# Patient Record
Sex: Female | Born: 1970 | Race: White | Hispanic: No | Marital: Married | Smoking: Former smoker
Health system: Southern US, Community
[De-identification: ages and names within clinical notes are randomized; demographics above are authoritative.]

## PROBLEM LIST (undated history)

## (undated) DIAGNOSIS — R002 Palpitations: Secondary | ICD-10-CM

## (undated) DIAGNOSIS — I341 Nonrheumatic mitral (valve) prolapse: Secondary | ICD-10-CM

## (undated) DIAGNOSIS — R071 Chest pain on breathing: Secondary | ICD-10-CM

## (undated) HISTORY — PX: DILATION AND CURETTAGE OF UTERUS: SHX78

## (undated) HISTORY — DX: Nonrheumatic mitral (valve) prolapse: I34.1

## (undated) HISTORY — DX: Palpitations: R00.2

## (undated) HISTORY — DX: Chest pain on breathing: R07.1

---

## 1997-05-01 ENCOUNTER — Emergency Department (HOSPITAL_COMMUNITY): Admission: EM | Admit: 1997-05-01 | Discharge: 1997-05-01 | Payer: Self-pay | Admitting: Emergency Medicine

## 1998-09-26 ENCOUNTER — Inpatient Hospital Stay (HOSPITAL_COMMUNITY): Admission: AD | Admit: 1998-09-26 | Discharge: 1998-09-26 | Payer: Self-pay | Admitting: Gynecology

## 1998-09-26 ENCOUNTER — Encounter: Payer: Self-pay | Admitting: Gynecology

## 1998-12-10 ENCOUNTER — Inpatient Hospital Stay (HOSPITAL_COMMUNITY): Admission: AD | Admit: 1998-12-10 | Discharge: 1998-12-13 | Payer: Self-pay | Admitting: Obstetrics and Gynecology

## 2000-09-22 ENCOUNTER — Other Ambulatory Visit: Admission: RE | Admit: 2000-09-22 | Discharge: 2000-09-22 | Payer: Self-pay | Admitting: Gynecology

## 2000-10-18 ENCOUNTER — Emergency Department (HOSPITAL_COMMUNITY): Admission: EM | Admit: 2000-10-18 | Discharge: 2000-10-18 | Payer: Self-pay | Admitting: Emergency Medicine

## 2001-04-12 ENCOUNTER — Emergency Department (HOSPITAL_COMMUNITY): Admission: EM | Admit: 2001-04-12 | Discharge: 2001-04-12 | Payer: Self-pay | Admitting: Emergency Medicine

## 2001-04-25 ENCOUNTER — Encounter: Payer: Self-pay | Admitting: Family Medicine

## 2001-04-25 ENCOUNTER — Encounter: Admission: RE | Admit: 2001-04-25 | Discharge: 2001-04-25 | Payer: Self-pay | Admitting: Family Medicine

## 2002-09-26 ENCOUNTER — Other Ambulatory Visit: Admission: RE | Admit: 2002-09-26 | Discharge: 2002-09-26 | Payer: Self-pay | Admitting: Gynecology

## 2003-06-04 ENCOUNTER — Encounter: Payer: Self-pay | Admitting: Internal Medicine

## 2003-06-04 ENCOUNTER — Emergency Department (HOSPITAL_COMMUNITY): Admission: EM | Admit: 2003-06-04 | Discharge: 2003-06-04 | Payer: Self-pay

## 2003-06-25 ENCOUNTER — Emergency Department (HOSPITAL_COMMUNITY): Admission: EM | Admit: 2003-06-25 | Discharge: 2003-06-25 | Payer: Self-pay | Admitting: Emergency Medicine

## 2003-07-19 ENCOUNTER — Inpatient Hospital Stay (HOSPITAL_COMMUNITY): Admission: AD | Admit: 2003-07-19 | Discharge: 2003-07-21 | Payer: Self-pay | Admitting: Gynecology

## 2003-08-25 ENCOUNTER — Inpatient Hospital Stay (HOSPITAL_COMMUNITY): Admission: AD | Admit: 2003-08-25 | Discharge: 2003-08-25 | Payer: Self-pay | Admitting: Gynecology

## 2003-09-02 ENCOUNTER — Other Ambulatory Visit: Admission: RE | Admit: 2003-09-02 | Discharge: 2003-09-02 | Payer: Self-pay | Admitting: Gynecology

## 2004-04-21 ENCOUNTER — Emergency Department (HOSPITAL_COMMUNITY): Admission: EM | Admit: 2004-04-21 | Discharge: 2004-04-21 | Payer: Self-pay | Admitting: Emergency Medicine

## 2005-09-21 ENCOUNTER — Other Ambulatory Visit: Admission: RE | Admit: 2005-09-21 | Discharge: 2005-09-21 | Payer: Self-pay | Admitting: Gynecology

## 2006-06-25 ENCOUNTER — Encounter: Payer: Self-pay | Admitting: Internal Medicine

## 2006-06-25 ENCOUNTER — Emergency Department (HOSPITAL_COMMUNITY): Admission: EM | Admit: 2006-06-25 | Discharge: 2006-06-25 | Payer: Self-pay | Admitting: Emergency Medicine

## 2006-06-27 ENCOUNTER — Ambulatory Visit: Payer: Self-pay | Admitting: Internal Medicine

## 2006-10-19 ENCOUNTER — Ambulatory Visit: Payer: Self-pay | Admitting: Internal Medicine

## 2007-03-03 ENCOUNTER — Ambulatory Visit: Payer: Self-pay | Admitting: Family Medicine

## 2007-07-11 ENCOUNTER — Telehealth (INDEPENDENT_AMBULATORY_CARE_PROVIDER_SITE_OTHER): Payer: Self-pay | Admitting: *Deleted

## 2007-10-23 ENCOUNTER — Ambulatory Visit: Payer: Self-pay | Admitting: Internal Medicine

## 2007-10-23 LAB — CONVERTED CEMR LAB
ALT: 11 units/L (ref 0–35)
AST: 16 units/L (ref 0–37)
Albumin: 4 g/dL (ref 3.5–5.2)
BUN: 9 mg/dL (ref 6–23)
Basophils Relative: 0.5 % (ref 0.0–3.0)
CO2: 29 meq/L (ref 19–32)
Calcium: 8.9 mg/dL (ref 8.4–10.5)
Chloride: 104 meq/L (ref 96–112)
Cholesterol: 137 mg/dL (ref 0–200)
Eosinophils Relative: 1.4 % (ref 0.0–5.0)
GFR calc Af Amer: 146 mL/min
Glucose, Bld: 86 mg/dL (ref 70–99)
Glucose, Urine, Semiquant: NEGATIVE
HCT: 34 % — ABNORMAL LOW (ref 36.0–46.0)
LDL Cholesterol: 76 mg/dL (ref 0–99)
Lymphocytes Relative: 38.1 % (ref 12.0–46.0)
MCHC: 34.8 g/dL (ref 30.0–36.0)
MCV: 89.7 fL (ref 78.0–100.0)
Monocytes Relative: 11.5 % (ref 3.0–12.0)
Neutrophils Relative %: 48.5 % (ref 43.0–77.0)
Platelets: 111 10*3/uL — ABNORMAL LOW (ref 150–400)
Potassium: 4.4 meq/L (ref 3.5–5.1)
RBC: 3.79 M/uL — ABNORMAL LOW (ref 3.87–5.11)
RDW: 12 % (ref 11.5–14.6)
Sodium: 139 meq/L (ref 135–145)
Total Protein: 6.6 g/dL (ref 6.0–8.3)
Triglycerides: 40 mg/dL (ref 0–149)
Urobilinogen, UA: 1
WBC Urine, dipstick: NEGATIVE
pH: 7

## 2007-10-30 ENCOUNTER — Ambulatory Visit: Payer: Self-pay | Admitting: Internal Medicine

## 2007-10-30 DIAGNOSIS — R002 Palpitations: Secondary | ICD-10-CM | POA: Insufficient documentation

## 2007-10-30 HISTORY — DX: Palpitations: R00.2

## 2007-11-03 ENCOUNTER — Ambulatory Visit: Payer: Self-pay | Admitting: Gynecology

## 2007-11-03 ENCOUNTER — Other Ambulatory Visit: Admission: RE | Admit: 2007-11-03 | Discharge: 2007-11-03 | Payer: Self-pay | Admitting: Gynecology

## 2007-11-03 ENCOUNTER — Encounter: Payer: Self-pay | Admitting: Gynecology

## 2007-11-10 ENCOUNTER — Encounter: Admission: RE | Admit: 2007-11-10 | Discharge: 2007-11-10 | Payer: Self-pay | Admitting: Gynecology

## 2008-06-24 ENCOUNTER — Ambulatory Visit (HOSPITAL_BASED_OUTPATIENT_CLINIC_OR_DEPARTMENT_OTHER): Admission: RE | Admit: 2008-06-24 | Discharge: 2008-06-24 | Payer: Self-pay | Admitting: Urology

## 2008-07-04 HISTORY — PX: BLADDER SUSPENSION: SHX72

## 2008-09-19 ENCOUNTER — Ambulatory Visit: Payer: Self-pay | Admitting: Gynecology

## 2008-10-25 ENCOUNTER — Ambulatory Visit: Payer: Self-pay | Admitting: Internal Medicine

## 2008-10-25 LAB — CONVERTED CEMR LAB
ALT: 14 units/L (ref 0–35)
AST: 18 units/L (ref 0–37)
Alkaline Phosphatase: 42 units/L (ref 39–117)
Basophils Absolute: 0 10*3/uL (ref 0.0–0.1)
Basophils Relative: 0.2 % (ref 0.0–3.0)
Cholesterol: 140 mg/dL (ref 0–200)
Creatinine, Ser: 0.7 mg/dL (ref 0.4–1.2)
Eosinophils Absolute: 0 10*3/uL (ref 0.0–0.7)
GFR calc non Af Amer: 100.43 mL/min (ref >60)
Glucose, Bld: 75 mg/dL (ref 70–99)
LDL Cholesterol: 77 mg/dL (ref 0–99)
Lymphocytes Relative: 25.8 % (ref 12.0–46.0)
MCHC: 33.3 g/dL (ref 30.0–36.0)
Monocytes Relative: 13.4 % — ABNORMAL HIGH (ref 3.0–12.0)
Platelets: 122 10*3/uL — ABNORMAL LOW (ref 150.0–400.0)
Specific Gravity, Urine: >=1.03 (ref 1.000–1.030)
Total Bilirubin: 1.4 mg/dL — ABNORMAL HIGH (ref 0.3–1.2)
Total Protein, Urine: 30 mg/dL
Urobilinogen, UA: 1 (ref 0.0–1.0)
WBC: 4.7 10*3/uL (ref 4.5–10.5)
pH: 5.5 (ref 5.0–8.0)

## 2008-10-30 ENCOUNTER — Encounter (INDEPENDENT_AMBULATORY_CARE_PROVIDER_SITE_OTHER): Payer: Self-pay | Admitting: *Deleted

## 2008-10-30 LAB — CONVERTED CEMR LAB
Bilirubin Urine: NEGATIVE
Blood in Urine, dipstick: NEGATIVE
Glucose, Urine, Semiquant: NEGATIVE
Ketones, urine, test strip: NEGATIVE
Specific Gravity, Urine: 1.01
pH: 6.5

## 2008-11-01 ENCOUNTER — Ambulatory Visit: Payer: Self-pay | Admitting: Internal Medicine

## 2008-11-04 ENCOUNTER — Other Ambulatory Visit: Admission: RE | Admit: 2008-11-04 | Discharge: 2008-11-04 | Payer: Self-pay | Admitting: Gynecology

## 2008-11-04 ENCOUNTER — Ambulatory Visit: Payer: Self-pay | Admitting: Gynecology

## 2008-11-04 ENCOUNTER — Encounter: Payer: Self-pay | Admitting: Gynecology

## 2008-12-23 ENCOUNTER — Ambulatory Visit: Payer: Self-pay | Admitting: Oncology

## 2008-12-23 ENCOUNTER — Inpatient Hospital Stay (HOSPITAL_COMMUNITY): Admission: EM | Admit: 2008-12-23 | Discharge: 2008-12-25 | Payer: Self-pay | Admitting: Internal Medicine

## 2008-12-27 ENCOUNTER — Ambulatory Visit: Payer: Self-pay | Admitting: Internal Medicine

## 2008-12-30 ENCOUNTER — Ambulatory Visit: Payer: Self-pay | Admitting: Oncology

## 2009-01-03 ENCOUNTER — Ambulatory Visit: Payer: Self-pay | Admitting: Internal Medicine

## 2009-01-06 LAB — CONVERTED CEMR LAB
Basophils Relative: 0.6 % (ref 0.0–3.0)
Eosinophils Relative: 0.8 % (ref 0.0–5.0)
Hemoglobin: 11.8 g/dL — ABNORMAL LOW (ref 12.0–15.0)
MCHC: 33.7 g/dL (ref 30.0–36.0)
Monocytes Relative: 11.8 % (ref 3.0–12.0)
Neutrophils Relative %: 69.1 % (ref 43.0–77.0)

## 2009-02-25 ENCOUNTER — Ambulatory Visit: Payer: Self-pay | Admitting: Oncology

## 2009-02-25 LAB — MORPHOLOGY

## 2009-02-25 LAB — CBC & DIFF AND RETIC
Basophils Absolute: 0 10*3/uL (ref 0.0–0.1)
EOS%: 0.5 % (ref 0.0–7.0)
Eosinophils Absolute: 0 10*3/uL (ref 0.0–0.5)
HCT: 36.1 % (ref 34.8–46.6)
Immature Retic Fract: 1.6 % (ref 0.00–10.70)
LYMPH%: 26.3 % (ref 14.0–49.7)
MCV: 89.6 fL (ref 79.5–101.0)
MONO#: 0.4 10*3/uL (ref 0.1–0.9)
MONO%: 7.3 % (ref 0.0–14.0)
NEUT#: 3.7 10*3/uL (ref 1.5–6.5)
NEUT%: 65.7 % (ref 38.4–76.8)
Platelets: 167 10*3/uL (ref 145–400)
RDW: 12.5 % (ref 11.2–14.5)
Retic %: 0.51 % (ref 0.50–1.50)
Retic Ct Abs: 20.55 10*3/uL (ref 18.30–72.70)
WBC: 5.6 10*3/uL (ref 3.9–10.3)

## 2009-02-25 LAB — COMPREHENSIVE METABOLIC PANEL
ALT: 8 U/L (ref 0–35)
AST: 15 U/L (ref 0–37)
BUN: 10 mg/dL (ref 6–23)
Potassium: 4.3 mEq/L (ref 3.5–5.3)
Sodium: 139 mEq/L (ref 135–145)
Total Protein: 7 g/dL (ref 6.0–8.3)

## 2009-02-25 LAB — SEDIMENTATION RATE: Sed Rate: 6 mm/hr (ref 0–22)

## 2009-02-25 LAB — CHCC SMEAR

## 2009-03-04 ENCOUNTER — Encounter: Payer: Self-pay | Admitting: Internal Medicine

## 2009-03-26 ENCOUNTER — Ambulatory Visit: Payer: Self-pay | Admitting: Family Medicine

## 2009-03-26 DIAGNOSIS — R071 Chest pain on breathing: Secondary | ICD-10-CM

## 2009-03-26 HISTORY — DX: Chest pain on breathing: R07.1

## 2009-03-27 ENCOUNTER — Ambulatory Visit: Payer: Self-pay | Admitting: Family Medicine

## 2009-07-26 IMAGING — MG MM DIAGNOSTIC BILATERAL
5 series · 5 of 5 positions shown · non-contrast
Comparison: None

November 14, 2007 –DUPLICATE COPY for exam association in [facility]. No change from original report.
CLINICAL DATA: Patient presents with palpable abnormality in the
 left breast. Baseline exam.

 DIGITAL DIAGNOSTIC BILATERAL MAMMOGRAM OLD WITH CAD AND LEFT
 BREAST ULTRASOUND:

[R CC]
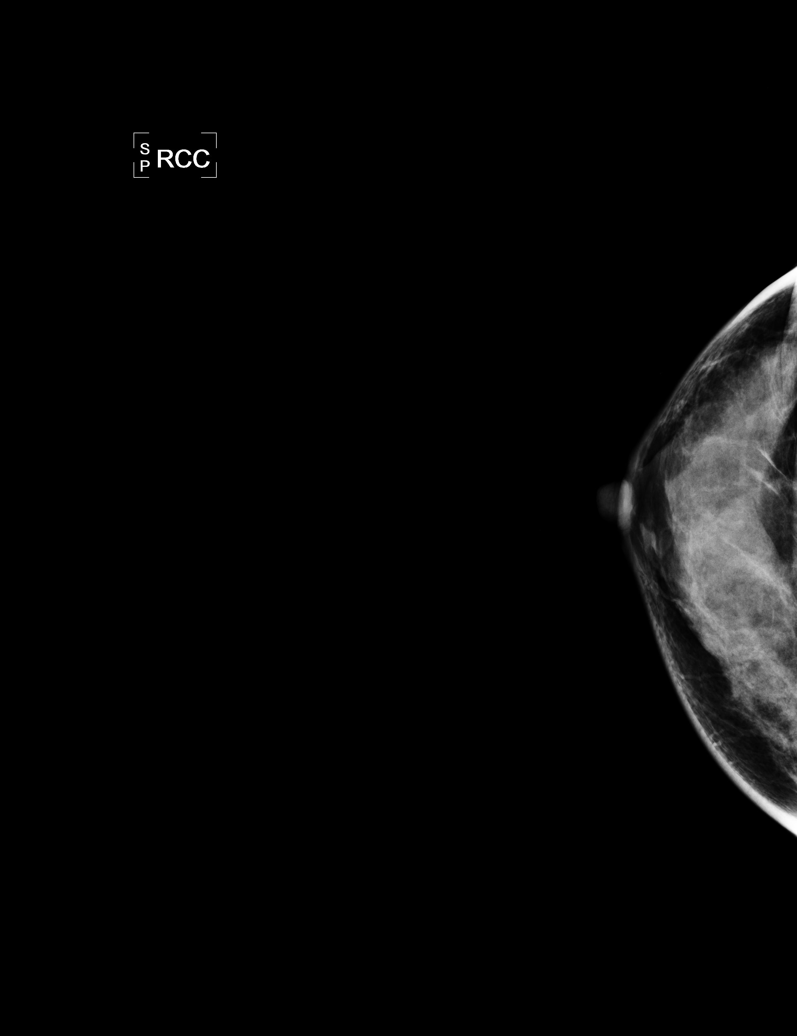

[L CC]
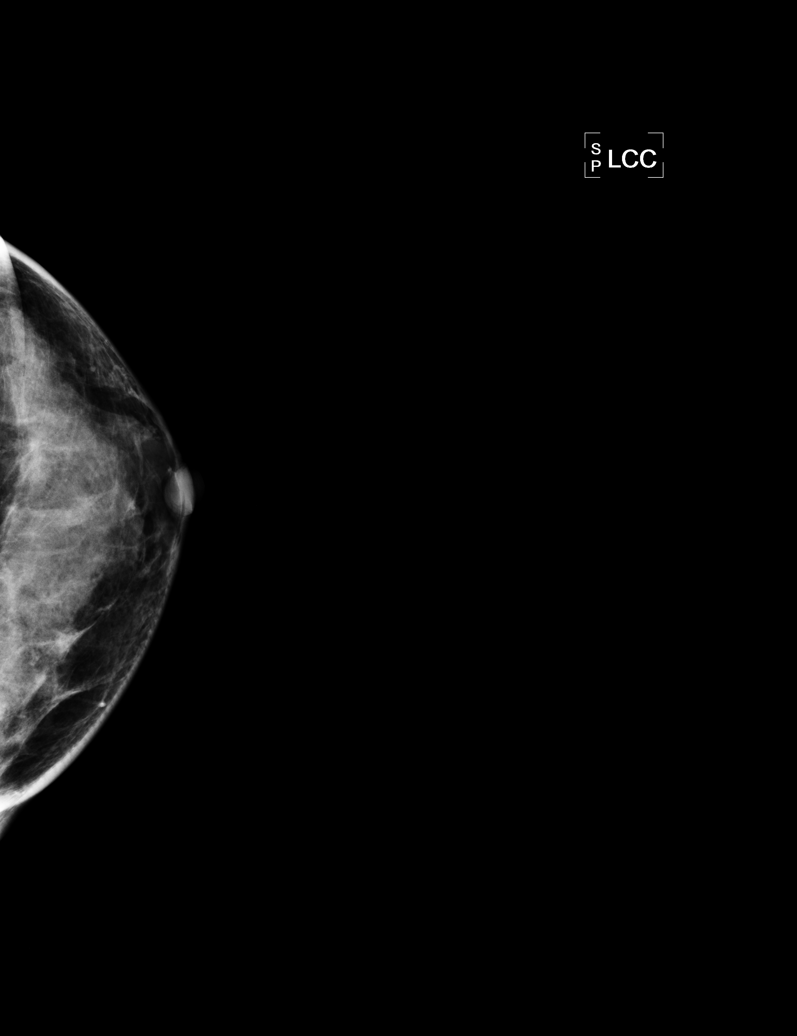

[L MLO]
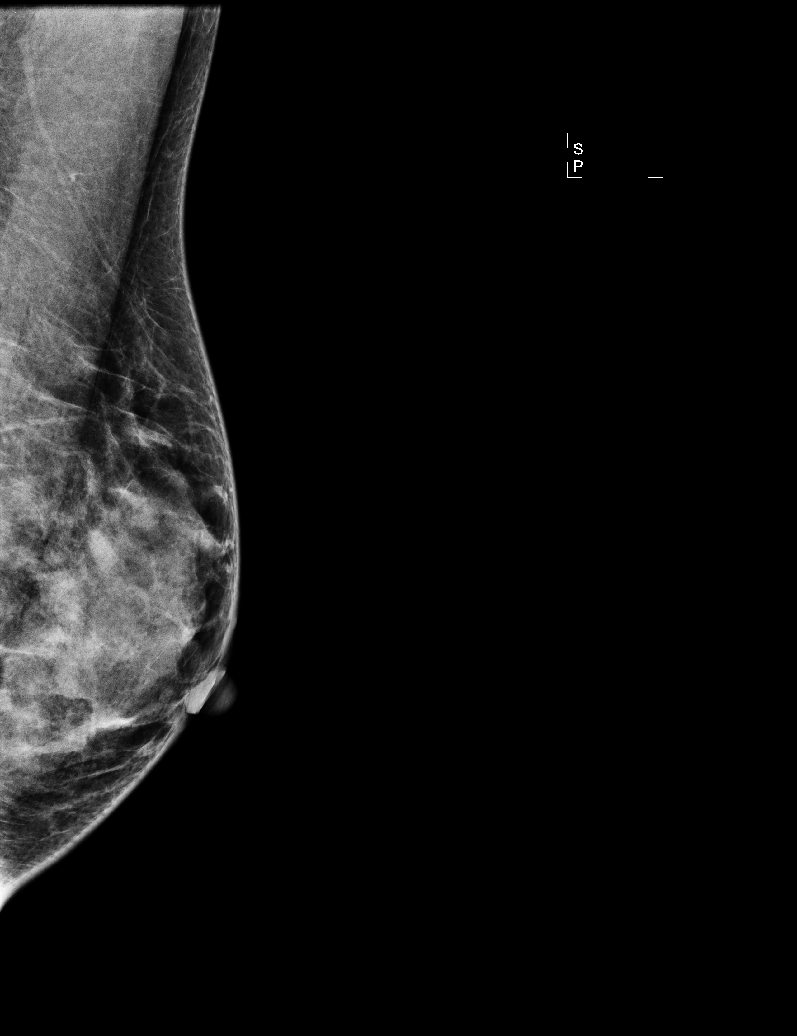

[R MLO]
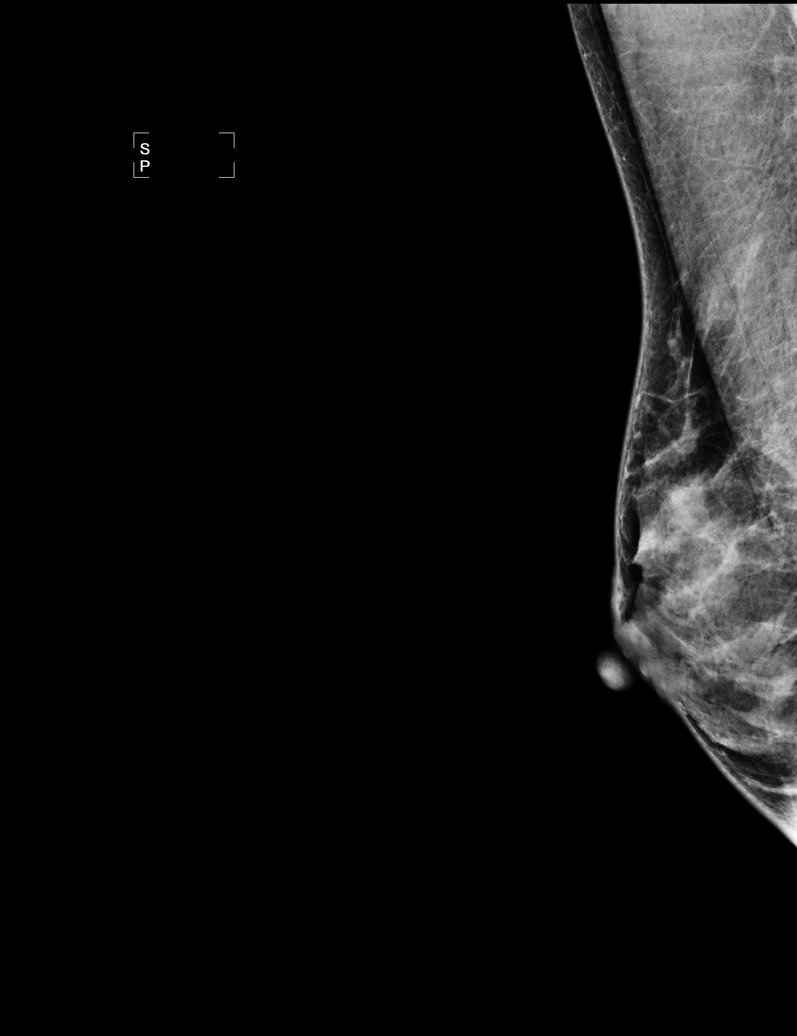

[L TAN]
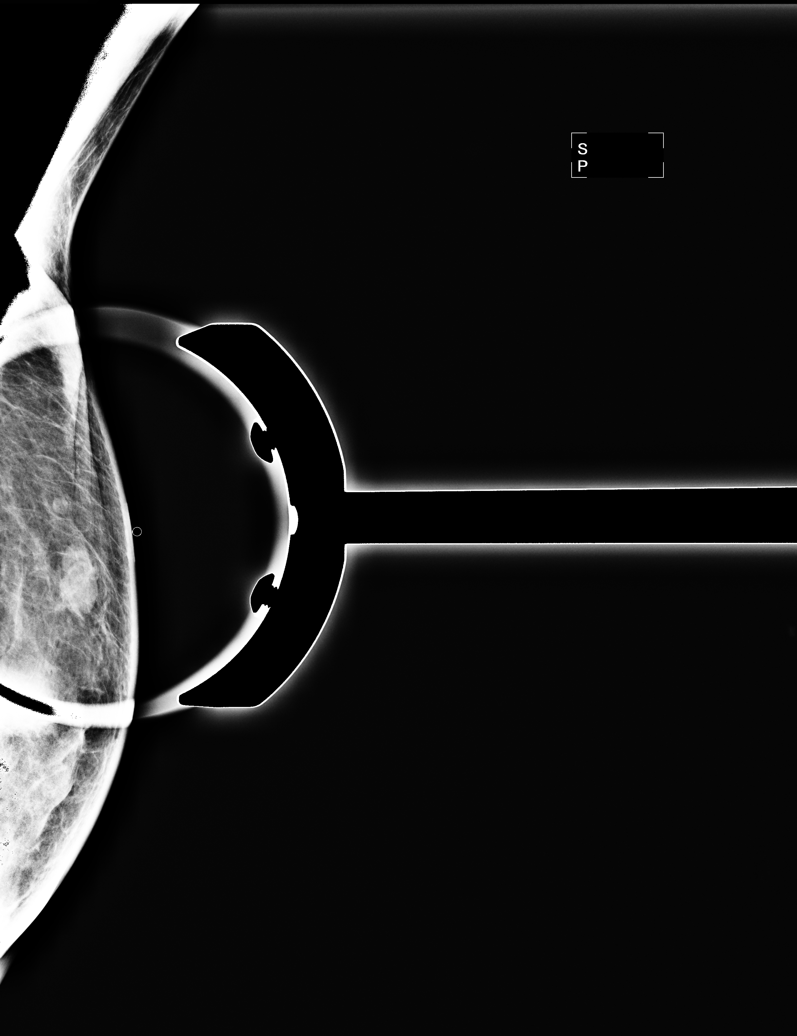

[5 of 5 positions shown; findings below may reference images not displayed]

FINDINGS: Breast parenchyma is extremely dense. A BB marks area of
 patient's concern in the far lateral aspect of the left breast. On
 spot tangential view of this region, a small oval bilobed nodule is
 identified, consistent with intramammary lymph node. No other
 mass, distortion, or suspicious microcalcifications are identified
 in either breast.

 On physical exam, I do palpate a soft, superficial nodule in the 3
 o'clock position of the left breast, 8 cm from the nipple.

 Ultrasound is performed, showing a superficial hypoechoic nodule
 with hyperechoic center, consistent with intramammary lymph node.
 This measures 1 x 1.3 cm and is consistent with an intramammary
 lymph node. No suspicious mass is identified in this region.
IMPRESSION: Palpable abnormality corresponds to a benign lymph node by
 mammogram and ultrasound. No evidence for malignancy. Screening
 mammogram is recommended beginning at age 40.

 BI-RADS CATEGORY 2: Benign finding(s).

## 2010-02-19 NOTE — Assessment & Plan Note (Signed)
Summary: coughing/hurts to take deep breath/cjr   Vital Signs:  Patient profile:   40 year old female BP sitting:   128 / 78  (left arm) Cuff size:   regular  Vitals Entered By: Sid Falcon LPN (March 26, 1608 3:32 PM) CC: coughing, hurts chest when coughing, headache, Cough   History of Present Illness: Acute visit. Onset last Fri low-grade fever which persisted over the weekend and possibly today. Some postnasal drainage but denies any sore throat. Diffuse body aches and some malaise. Cough mostly nonproductive.  also complains of a deep centered chest pain with breathing. No hemoptysis and no tachypnea or tachycardia. Nonsmoker. Cough especially bothersome at night.  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  The patient reports non-productive cough and pleuritic chest pain, but denies shortness of breath, wheezing, exertional dyspnea, and hemoptysis.  The patient denies the following symptoms: acid reflux symptoms and peripheral edema.  Ineffective prior treatments have included OTC cough medication.    Allergies: 1)  ! Tetracycline (Tetracycline) 2)  ! Bicillin L-A (Penicillin G Benzathine) 3)  ! Erythromycin (Erythromycin) 4)  ! Bactrim (Sulfamethoxazole-Trimethoprim)  Past History:  Past Medical History: Last updated: 10/30/2007 G3 P2, A1 perimenopausal syndrome palpitations mitral valve prolapse  Social History: Last updated: 11/01/2008 Occupation:[employer] Married Never Smoked Alcohol use-no Drug use-no Regular exercise-yes 4 times weekly PMH reviewed for relevance  Review of Systems      See HPI  Physical Exam  General:  Well-developed,well-nourished,in no acute distress; alert,appropriate and cooperative throughout examination Ears:  External ear exam shows no significant lesions or  deformities.  Otoscopic examination reveals clear canals, tympanic membranes are intact bilaterally without bulging, retraction, inflammation or discharge. Hearing is grossly normal bilaterally. Nose:  External nasal examination shows no deformity or inflammation. Nasal mucosa are pink and moist without lesions or exudates. Mouth:  Oral mucosa and oropharynx without lesions or exudates.  Teeth in good repair. Neck:  No deformities, masses, or tenderness noted. Lungs:  Normal respiratory effort, chest expands symmetrically. Lungs are clear to auscultation, no crackles or wheezes. Heart:  Normal rate and regular rhythm. S1 and S2 normal without gallop, murmur, click, rub or other extra sounds. Extremities:  no edema.   Impression & Recommendations:  Problem # 1:  CHEST PAIN, PLEURITIC (ICD-786.52) ?pleurisy vs musculoskelatal.  Orders: T-2 View CXR (71020TC)  Problem # 2:  COUGH (ICD-786.2) Suspect acute viral bronchitis.  Cough med to use as needed. Orders: T-2 View CXR (71020TC)  Complete Medication List: 1)  Hydrocodone-homatropine 5-1.5 Mg/49ml Syrp (Hydrocodone-homatropine) .... One tsp by mouth q 4-6 hours as needed cough  Patient Instructions: 1)  follow up immediately if you develop any increased fever, shortness of breath, or worsening symptoms. Prescriptions: HYDROCODONE-HOMATROPINE 5-1.5 MG/5ML SYRP (HYDROCODONE-HOMATROPINE) one tsp by mouth q 4-6 hours as needed cough  #120 ml x 0   Entered and Authorized by:   Evelena Peat MD   Signed by:   Evelena Peat MD on 03/26/2009   Method used:   Print then Give to Patient   RxID:   [id number]

## 2010-02-19 NOTE — Letter (Signed)
[facility]   Imported By: Maryln Gottron 03/28/2009 11:11:46  _____________________________________________________________________  External Attachment:    Type:   Image     Comment:   External Document

## 2010-04-21 LAB — CBC
HCT: 29.4 % — ABNORMAL LOW (ref 36.0–46.0)
HCT: 29.8 % — ABNORMAL LOW (ref 36.0–46.0)
HCT: 32.6 % — ABNORMAL LOW (ref 36.0–46.0)
Hemoglobin: 10.1 g/dL — ABNORMAL LOW (ref 12.0–15.0)
Hemoglobin: 11.2 g/dL — ABNORMAL LOW (ref 12.0–15.0)
Hemoglobin: 9.9 g/dL — ABNORMAL LOW (ref 12.0–15.0)
MCHC: 33.6 g/dL (ref 30.0–36.0)
MCHC: 33.6 g/dL (ref 30.0–36.0)
MCHC: 33.7 g/dL (ref 30.0–36.0)
MCHC: 34.4 g/dL (ref 30.0–36.0)
MCV: 89.4 fL (ref 78.0–100.0)
MCV: 89.7 fL (ref 78.0–100.0)
MCV: 90.3 fL (ref 78.0–100.0)
Platelets: 70 10*3/uL — ABNORMAL LOW (ref 150–400)
RBC: 3.25 MIL/uL — ABNORMAL LOW (ref 3.87–5.11)
RBC: 3.32 MIL/uL — ABNORMAL LOW (ref 3.87–5.11)
RBC: 3.34 MIL/uL — ABNORMAL LOW (ref 3.87–5.11)
RBC: 3.64 MIL/uL — ABNORMAL LOW (ref 3.87–5.11)
RDW: 12.4 % (ref 11.5–15.5)
RDW: 12.7 % (ref 11.5–15.5)
RDW: 12.9 % (ref 11.5–15.5)
RDW: 12.9 % (ref 11.5–15.5)
WBC: 1.2 10*3/uL — CL (ref 4.0–10.5)
WBC: 1.8 K/uL — ABNORMAL LOW (ref 4.0–10.5)

## 2010-04-21 LAB — DIFFERENTIAL
Basophils Absolute: 0 10*3/uL (ref 0.0–0.1)
Basophils Relative: 0 % (ref 0–1)
Basophils Relative: 0 % (ref 0–1)
Eosinophils Absolute: 0 10*3/uL (ref 0.0–0.7)
Eosinophils Absolute: 0 K/uL (ref 0.0–0.7)
Eosinophils Relative: 0 % (ref 0–5)
Eosinophils Relative: 0 % (ref 0–5)
Lymphocytes Relative: 17 % (ref 12–46)
Lymphs Abs: 0.3 10*3/uL — ABNORMAL LOW (ref 0.7–4.0)
Monocytes Absolute: 0.2 K/uL (ref 0.1–1.0)
Monocytes Relative: 10 % (ref 3–12)
Neutro Abs: 1 10*3/uL — ABNORMAL LOW (ref 1.7–7.7)
Neutro Abs: 1.3 10*3/uL — ABNORMAL LOW (ref 1.7–7.7)
Neutrophils Relative %: 73 % (ref 43–77)
Neutrophils Relative %: 83 % — ABNORMAL HIGH (ref 43–77)

## 2010-04-21 LAB — POCT I-STAT, CHEM 8
BUN: 12 mg/dL (ref 6–23)
HCT: 36 % (ref 36.0–46.0)
Hemoglobin: 12.2 g/dL (ref 12.0–15.0)
Potassium: 3.5 mEq/L (ref 3.5–5.1)
Sodium: 135 mEq/L (ref 135–145)
TCO2: 21 mmol/L (ref 0–100)

## 2010-04-21 LAB — URINALYSIS, ROUTINE W REFLEX MICROSCOPIC
Bilirubin Urine: NEGATIVE
Glucose, UA: NEGATIVE mg/dL
Hgb urine dipstick: NEGATIVE
Ketones, ur: 40 mg/dL — AB
Nitrite: NEGATIVE
Protein, ur: NEGATIVE mg/dL
Specific Gravity, Urine: 1.023 (ref 1.005–1.030)
Urobilinogen, UA: 0.2 mg/dL (ref 0.0–1.0)
pH: 6 (ref 5.0–8.0)

## 2010-04-21 LAB — CULTURE, BLOOD (ROUTINE X 2)
Culture: NO GROWTH
Culture: NO GROWTH

## 2010-04-21 LAB — URINE CULTURE
Colony Count: NO GROWTH
Culture: NO GROWTH

## 2010-04-21 LAB — MAGNESIUM: Magnesium: 1.6 mg/dL (ref 1.5–2.5)

## 2010-04-21 LAB — BASIC METABOLIC PANEL
CO2: 21 mEq/L (ref 19–32)
GFR calc non Af Amer: >60 mL/min (ref >60)
Potassium: 4.2 mEq/L (ref 3.5–5.1)
Sodium: 135 mEq/L (ref 135–145)

## 2010-04-21 LAB — FERRITIN: Ferritin: 118 ng/mL (ref 10–291)

## 2010-04-21 LAB — IRON AND TIBC
TIBC: 235 ug/dL — ABNORMAL LOW (ref 250–470)
UIBC: 128 ug/dL

## 2010-04-21 LAB — FOLATE: Folate: 16.2 ng/mL

## 2010-04-21 LAB — RETICULOCYTES
RBC.: 3.45 MIL/uL — ABNORMAL LOW (ref 3.87–5.11)
Retic Ct Pct: 0.4 % (ref 0.4–3.1)

## 2010-04-21 LAB — POCT PREGNANCY, URINE: Preg Test, Ur: NEGATIVE

## 2010-04-27 LAB — POCT HEMOGLOBIN-HEMACUE: Hemoglobin: 12.1 g/dL (ref 12.0–15.0)

## 2010-04-27 LAB — CBC
HCT: 33 % — ABNORMAL LOW (ref 36.0–46.0)
MCV: 89.8 fL (ref 78.0–100.0)
Platelets: 102 10*3/uL — ABNORMAL LOW (ref 150–400)
RDW: 12.3 % (ref 11.5–15.5)
WBC: 3.4 10*3/uL — ABNORMAL LOW (ref 4.0–10.5)

## 2010-04-27 LAB — DIFFERENTIAL
Basophils Absolute: 0 10*3/uL (ref 0.0–0.1)
Basophils Relative: 0 % (ref 0–1)
Eosinophils Absolute: 0.1 10*3/uL (ref 0.0–0.7)
Eosinophils Relative: 2 % (ref 0–5)
Lymphs Abs: 1.4 10*3/uL (ref 0.7–4.0)
Neutrophils Relative %: 45 % (ref 43–77)

## 2010-04-27 LAB — PROTIME-INR
INR: 1.1 (ref 0.00–1.49)
Prothrombin Time: 14.5 seconds (ref 11.6–15.2)

## 2010-05-04 ENCOUNTER — Ambulatory Visit (INDEPENDENT_AMBULATORY_CARE_PROVIDER_SITE_OTHER): Payer: BLUE CROSS/BLUE SHIELD | Admitting: Gynecology

## 2010-05-04 DIAGNOSIS — N63 Unspecified lump in unspecified breast: Secondary | ICD-10-CM

## 2010-05-19 ENCOUNTER — Encounter (INDEPENDENT_AMBULATORY_CARE_PROVIDER_SITE_OTHER): Payer: BLUE CROSS/BLUE SHIELD | Admitting: Gynecology

## 2010-05-19 ENCOUNTER — Other Ambulatory Visit: Payer: Self-pay | Admitting: Gynecology

## 2010-05-19 ENCOUNTER — Other Ambulatory Visit (HOSPITAL_COMMUNITY)
Admission: RE | Admit: 2010-05-19 | Discharge: 2010-05-19 | Disposition: A | Discharge status: Home / Self Care | Payer: BLUE CROSS/BLUE SHIELD | Source: Ambulatory Visit | Attending: Gynecology | Admitting: Gynecology

## 2010-05-19 DIAGNOSIS — Z01419 Encounter for gynecological examination (general) (routine) without abnormal findings: Secondary | ICD-10-CM

## 2010-05-19 DIAGNOSIS — Z124 Encounter for screening for malignant neoplasm of cervix: Secondary | ICD-10-CM | POA: Insufficient documentation

## 2010-05-19 DIAGNOSIS — Z13220 Encounter for screening for lipoid disorders: Secondary | ICD-10-CM

## 2010-05-20 ENCOUNTER — Other Ambulatory Visit: Payer: Self-pay | Admitting: Gynecology

## 2010-05-22 ENCOUNTER — Ambulatory Visit
Admission: RE | Admit: 2010-05-22 | Discharge: 2010-05-22 | Disposition: A | Discharge status: Home / Self Care | Payer: BLUE CROSS/BLUE SHIELD | Source: Ambulatory Visit | Attending: Gynecology | Admitting: Gynecology

## 2010-06-02 NOTE — Assessment & Plan Note (Signed)
[facility] OFFICE NOTE   NAME:Allison Lowery, Allison Lowery                    MRN:          [id number]  DATE:06/27/2006                            DOB:          [birth year]    A 40 year old female seen today to establish with our practice.  She was  evaluated over the weekend due to nausea, vomiting, diarrhea.  Evaluation was largely negative.  At the present time, she has  persistent nausea, tolerating a light diet, with no active emesis.  Her  pain and fever have resolved.  Still has some explosive diarrhea with  incontinence.   Past medical history is really unremarkable.  She has had a D&C in 1994.  She is a gravida 3, para 2, abortus 1.  There were no other hospital  admissions.   She is sensitive to PENICILLIN, ERYTHROMYCIN, and TETRACYCLINE.   FAMILY HISTORY:  Noncontributory.  Both parents are in excellent health.  Mother has hypertension.  One brother died of a drowning death.  One  sister is well.   PHYSICAL EXAMINATION:  GENERAL:  A healthy-appearing, fit young lady in  no acute distress.  VITAL SIGNS:  Blood pressure was low normal.  Pulse was normal.  She was  afebrile.  SKIN:  Warm and dry without rash.  HEAD/NECK:  Normal fundi.  Ears, nose, and throat clear.  No bruits or  adenopathy.  CHEST:  Clear.  CARDIOVASCULAR:  A mid systolic click.  No murmur.  ABDOMEN:  Benign.  No tenderness.  Bowel sounds active.  EXTREMITIES:  Negative.  Full peripheral pulses.   IMPRESSION:  1. Resolving viral gastroenteritis.  2. Mitral valve prolapse.   DISPOSITION:  She will be treated symptomatically with an antiemetic,  antidiarrheal.  She will slowly advance her diet.  Return here p.r.n.     Gordy Savers, MD  Electronically Signed    PFK/MedQ  DD: 06/27/2006  DT: 06/27/2006  Job #: [id number]

## 2010-06-02 NOTE — Op Note (Signed)
NAME:  EMMANUEL, ERCOLE             ACCOUNT NO.:  [id number]   MEDICAL RECORD NO.:  [id number]          PATIENT TYPE:  AMB   LOCATION:  [facility]                         FACILITY:  [facility]   PHYSICIAN:  Martina Sinner, MD DATE OF BIRTH:  [birth year]   DATE OF PROCEDURE:  06/24/2008  DATE OF DISCHARGE:                               OPERATIVE REPORT   PREOPERATIVE DIAGNOSIS:  Stress urinary incontinence.   POSTOPERATIVE DIAGNOSIS:  Stress urinary incontinence.   SURGERY:  Sling cystourethropexy(SPARC), plus cystoscopy.   Christell Steinmiller has mixed stress urge incontinence.  The stress  incontinence is affecting her quality life.  She consented to a sling.   DESCRIPTION OF PROCEDURE:  The patient is prepped and draped in usual  fashion.  She was given gentamicin.  Extra care was taken with leg  positioning to minimize risk of compartment syndrome neuropathy and DVT.   Two 1 cm incisions were made, one fingerbreadth above the symphysis  pubis 1.5 cm lateral to the midline.  I made a 2 cm incision underlying  the mid urethra.  I instilled 7 mL lidocaine and epinephrine mixture.  I  dissected sharply to urethrovesical angle bilaterally.   With the bladder emptied, I passed the [facility] needle on top of and along  the back of the symphysis pubis on the pulp of my index finger  bilaterally.  I cystoscoped the patient.  There is no indentation on the  bladder or entry into the bladder.  There is no injury to bladder,  urethra, or trigone.  Good ureteral jets bilaterally that were blue.   With the bladder empty I attached the [facility] sling and drew it up through  the retropubic space and tensioned over the fat part of a moderate-sized  Kelly clamp.  I cut below the blue dots and removed the sheath.  I was  very pleased with the tension of the sling.  It had appropriate  hypermobility and little to no spring back.   Copious irrigation was utilized.  My closure used 2-0 Vicryl running  suture  followed by two interrupted sutures.  I cut the sling below the  skin and closed with a one 4-0 Vicryl followed by Dermabond.   Bleeding was less than 30 mL.  Procedure went very well.  Catheter was  placed.  Vaginal pack was placed.  Hopefully this operation will reach  her treatment goal.           ______________________________  Martina Sinner, MD  Electronically Signed     SAM/MEDQ  D:  06/24/2008  T:  06/24/2008  Job:  [id number]

## 2010-06-05 NOTE — Consult Note (Signed)
NAME:  Allison Lowery, Allison Lowery                       ACCOUNT NO.:  [id number]   MEDICAL RECORD NO.:  [id number]                   PATIENT TYPE:  EMS   LOCATION:  [facility]                                 FACILITY:  [facility]   PHYSICIAN:  Melvyn Novas, M.D.               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  She is currently [redacted] weeks pregnant with her second child and her pregnancy  has been uneventful.  Today at work she noticed suddenly a vision loss and  she has had similar events in the past associated with migraine.  The visual  aura is described as a right sided visual field, clouding, fogginess and was  relieved with some Trientine within two to three minutes.  However, the  headache that she expected to get never set on.  Instead she felt tingling  in the mouth, first in the right hand then also in the left and then in both  lower extremities and noticed that when she tried to read she could not  quite make out the words.  She also could not remember names of co-workers  she has been very familiar with.  It was this kind of a confusional episode  after a non-headache provoked migraine aura that led to her evaluation in  the ER.  She arrived here at about 10:30 a.m.   LABORATORY RESULTS:  White blood cell count 5.6, red blood cell count 3.32,  hemoglobin 10/29 hematocrit, platelet count 145, granulocytes 3.9,  neutrophils 69.  Lipase and amylase were also drawn.  Lipase was 24 and  amylase 72.  The patient had a normal EKG.   PHYSICAL EXAMINATION:  VITAL SIGNS:  Her vital signs were stable.  Diastolic  highest pressure 85; systolic 115.  Heart rate 68 and regular; respiratory  rate 18 at baseline.  LUNGS:  Clear to auscultation.  No carotid bruits.  No goiter.  GENERAL:  The patient is in no acute distress and has no  persistent  neurologic deficit or sensory abnormalities.   Urine was tested and tested negative for UTI.   Her OB/GYN, Dr. Audie Lowery, was called and asked for a neurology evaluation.   MENTAL STATUS:  Alert and oriented times three.  No dysarthria, aphasia,  memory deficit or attention deficit.  The patient is appropriate in her  affect and compliance.  Cranial nerves:  Pupils were equal to light and  accommodation, but the patient with contacts.  She shows normal sharp discs  and no vascular damage to the retinal area.  No ciliary injection.  No  scleral icterus.  Extraocular movements were intact and the visual field is  now bilaterally equal.  The patient shows a symmetric facial structure.  Tongue and uvula movements are intact with  normal gag.  The patient has no  sensory loss over her face to primary modalities.   Motor 5/5 equal upper and lower extremity strength, similar tone and mass.  No cogwheeling, no tremor.  Normal finger to nose test.  No dysmetria.  Gait  and station intact.  No history of syncope or presyncopal feeling.  All  modalities are preserved to light touch in the extremities and trunk.   FAMILY HISTORY:  Noncontributory.   SOCIAL HISTORY:  The patient is pregnant with her second child after a  normal pregnancy with her first born.  She is due in July of this year.  Nonsmoker, nondrinker.  Lives with husband and her first born.  She works as  a Water quality scientist, high school degree.   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  Appeared tearful at  the first evaluation by Dr. Wallace Cullens in the ER, but has now resumed normal  behavior and appropriate affect.  No past medical history.   PLAN:  The patient was reassured that this is most likely a migrainous  event, even if no headache occurred.  That confusional migraines can occur  in pregnancy and she was told that she is not developing  preeclampsia  symptoms and I have not been able to review her tox screen at this point.  I  assured her that she can rest assured that this is not a TIA.  If symptoms  reoccur, I would like her to come to the ER at [facility] as long as  they occur during pregnancy.  She will resume prenatal vitamins at home and  oral magnesium for leg dysesthesias and to soften stools.                                               Melvyn Novas, M.D.    CD/MEDQ  D:  06/04/2003  T:  06/05/2003  Job:  [id number]   cc:   Marcial Pacas P. Allison Lowery, M.D.  [address]  [place]  [place] [address]  Fax: [phone]

## 2010-06-05 NOTE — Op Note (Signed)
NAME:  Allison Lowery, Allison Lowery                       ACCOUNT NO.:  [id number]   MEDICAL RECORD NO.:  [id number]                   PATIENT TYPE:  INP   LOCATION:  9129                                 FACILITY:  [facility]   PHYSICIAN:  Charles A. Sydnee Cabal, MD            DATE OF BIRTH:  06/01/1970   DATE OF PROCEDURE:  07/19/2003  DATE OF DISCHARGE:  07/21/2003                                 OPERATIVE REPORT   DELIVERY NOTE:  This patient was admitted by Dr. Lily Peer this morning.  She presented in somewhat prodromal labor and was noted to have labile blood  pressures.  Some have been in the 140/90s range.  The patient's cervix was 1-  2, 80%, -3 station.  She was admitted, group B strep negative.  Prenatal  labs as noted in the chart otherwise.  Artificial rupture of membranes was  done to initiate labor secondary to the above problem, and clear amniotic  fluid was noted.  Internal monitors, scalp electrode, intrauterine pressure  catheter were all placed without complication.  Pitocin was initiated.  She  got into a regular pattern with adequate [place] units, about 1300 was  noted to be 4 cm dilated per R.N.  At that time fetal heart rate 140-150s,  reactive, without decelerations.  She complained of moderate contractions,  and an epidural was placed at her request.  Of note, PIH panel this morning  was normal.  She progressed to complete cervical dilatation at 1635.  She  had spontaneous vaginal delivery at 1702.  The placenta followed  spontaneously about 10 minutes later.  She had vaginal delivery of a  vigorous female, Apgars 8 and 9.  The father of the baby cut the cord.  Nuchal  cord x1 was reduced.  The spontaneous was spontaneous, three vessels, and  intact.  Estimated blood loss 300 mL.  A small labial laceration was noted  on the right and left, slightly larger on the right and bleeding on the  right.  The right was sutured with 3-0 chromic.  Hemostasis was excellent.  The remainder  of the peritoneum and rectal evaluation were intact.  She has  a history of a fourth degree laceration and the perineum was otherwise  intact as noted at this delivery.  Mother and baby were recovering stable at  this time.  The baby is having some grunting and will likely be taken to the  nursery.  We will proceed with routine postpartum care as ordered.                                               Charles A. Sydnee Cabal, MD    CAD/MEDQ  D:  07/19/2003  T:  07/22/2003  Job:  [id number]

## 2010-10-05 ENCOUNTER — Ambulatory Visit (INDEPENDENT_AMBULATORY_CARE_PROVIDER_SITE_OTHER): Payer: BLUE CROSS/BLUE SHIELD | Admitting: Internal Medicine

## 2010-10-05 ENCOUNTER — Encounter: Payer: Self-pay | Admitting: Internal Medicine

## 2010-10-05 VITALS — BP 130/80 | HR 60 | Temp 98.1°F | Resp 16 | Wt 129.0 lb

## 2010-10-05 DIAGNOSIS — I341 Nonrheumatic mitral (valve) prolapse: Secondary | ICD-10-CM

## 2010-10-05 DIAGNOSIS — R002 Palpitations: Secondary | ICD-10-CM

## 2010-10-05 DIAGNOSIS — I059 Rheumatic mitral valve disease, unspecified: Secondary | ICD-10-CM

## 2010-10-05 NOTE — Patient Instructions (Signed)
check your pulse when you  develope a sense of palpitations Take 2 ibuprofen at bedtime Consider using a left wrist brace  Call or return to clinic prn if these symptoms worsen or fail to improve as anticipated.

## 2010-10-05 NOTE — Progress Notes (Signed)
  Subjective:    Patient ID: Allison Lowery, female    DOB: October 18, 1970, 40 y.o.   MRN: [id number]  HPI 40 year old patient who has a history of mitral valve prolapse and palpitations. She presents today with a complaint of intermittent palpitations rated the scene the abrupt onset and lasts less than 1 minute. Her chief complaint is numbness involving the left arm and especially the left thumb. She does have a sister with severe carpal tunnel syndrome denies any pain; she is right arm dominant. No motor weakness   Review of Systems  Constitutional: Negative.   HENT: Negative for hearing loss, congestion, sore throat, rhinorrhea, dental problem, sinus pressure and tinnitus.   Eyes: Negative for pain, discharge and visual disturbance.  Respiratory: Negative for cough and shortness of breath.   Cardiovascular: Positive for palpitations. Negative for chest pain and leg swelling.  Gastrointestinal: Negative for nausea, vomiting, abdominal pain, diarrhea, constipation, blood in stool and abdominal distention.  Genitourinary: Negative for dysuria, urgency, frequency, hematuria, flank pain, vaginal bleeding, vaginal discharge, difficulty urinating, vaginal pain and pelvic pain.  Musculoskeletal: Negative for joint swelling, arthralgias and gait problem.  Skin: Negative for rash.  Neurological: Positive for numbness. Negative for dizziness, syncope, speech difficulty, weakness and headaches.  Hematological: Negative for adenopathy.  Psychiatric/Behavioral: Negative for behavioral problems, dysphoric mood and agitation. The patient is not nervous/anxious.        Objective:   Physical Exam  Constitutional: She is oriented to person, place, and time. She appears well-developed and well-nourished.  HENT:  Head: Normocephalic.  Right Ear: External ear normal.  Left Ear: External ear normal.  Mouth/Throat: Oropharynx is clear and moist.  Eyes: Conjunctivae and EOM are normal. Pupils are equal,  round, and reactive to light.  Neck: Normal range of motion. Neck supple. No thyromegaly present.  Cardiovascular: Normal rate, regular rhythm, normal heart sounds and intact distal pulses.   Pulmonary/Chest: Effort normal and breath sounds normal.  Abdominal: Soft. Bowel sounds are normal. She exhibits no mass. There is no tenderness.  Musculoskeletal: Normal range of motion.  Lymphadenopathy:    She has no cervical adenopathy.  Neurological: She is alert and oriented to person, place, and time.       Some subjective numbness involving the left thumb over the dorsal aspect only No thenar atrophy No motor weakness Tinel's negative  Skin: Skin is warm and dry. No rash noted.  Psychiatric: She has a normal mood and affect. Her behavior is normal.          Assessment & Plan:   Palpitations. An EKG was reviewed today and did reveal a sinus bradycardia with a short PR interval. She was asked to obtain a pulse rate when she has palpitations. Numbness left arm and thumb. Probable mild carpal tunnel syndrome. She will try ibuprofen at bedtime and a wrist brace. She'll call if unimproved

## 2010-11-05 LAB — COMPREHENSIVE METABOLIC PANEL
Albumin: 4.4
Alkaline Phosphatase: 43
BUN: 11
CO2: 22
Creatinine, Ser: 0.82
GFR calc Af Amer: >60
GFR calc non Af Amer: >60
Glucose, Bld: 119 — ABNORMAL HIGH
Sodium: 130 — ABNORMAL LOW
Total Bilirubin: 1.2

## 2010-11-05 LAB — DIFFERENTIAL
Basophils Absolute: 0
Basophils Relative: 1
Lymphocytes Relative: 9 — ABNORMAL LOW
Neutro Abs: 3.2
Neutrophils Relative %: 82 — ABNORMAL HIGH

## 2010-11-05 LAB — WET PREP, GENITAL
Clue Cells Wet Prep HPF POC: NONE SEEN
Trich, Wet Prep: NONE SEEN
Yeast Wet Prep HPF POC: NONE SEEN

## 2010-11-05 LAB — URINALYSIS, ROUTINE W REFLEX MICROSCOPIC
Glucose, UA: NEGATIVE
Protein, ur: NEGATIVE
Specific Gravity, Urine: 1.026

## 2010-11-05 LAB — LIPASE, BLOOD: Lipase: 16

## 2010-11-05 LAB — POCT PREGNANCY, URINE
Operator id: 285631
Preg Test, Ur: NEGATIVE

## 2010-11-05 LAB — CBC
MCHC: 34.2
RBC: 4.58
RDW: 12.3

## 2010-11-05 LAB — GC/CHLAMYDIA PROBE AMP, GENITAL: Chlamydia, DNA Probe: NEGATIVE

## 2010-12-11 IMAGING — CR DG CHEST 1V
1 series · 1 of 1 positions shown · non-contrast
Comparison: 03/26/2009

CLINICAL DATA: Right upper lobe nodule

CHEST - 1 VIEW

[view not recorded]
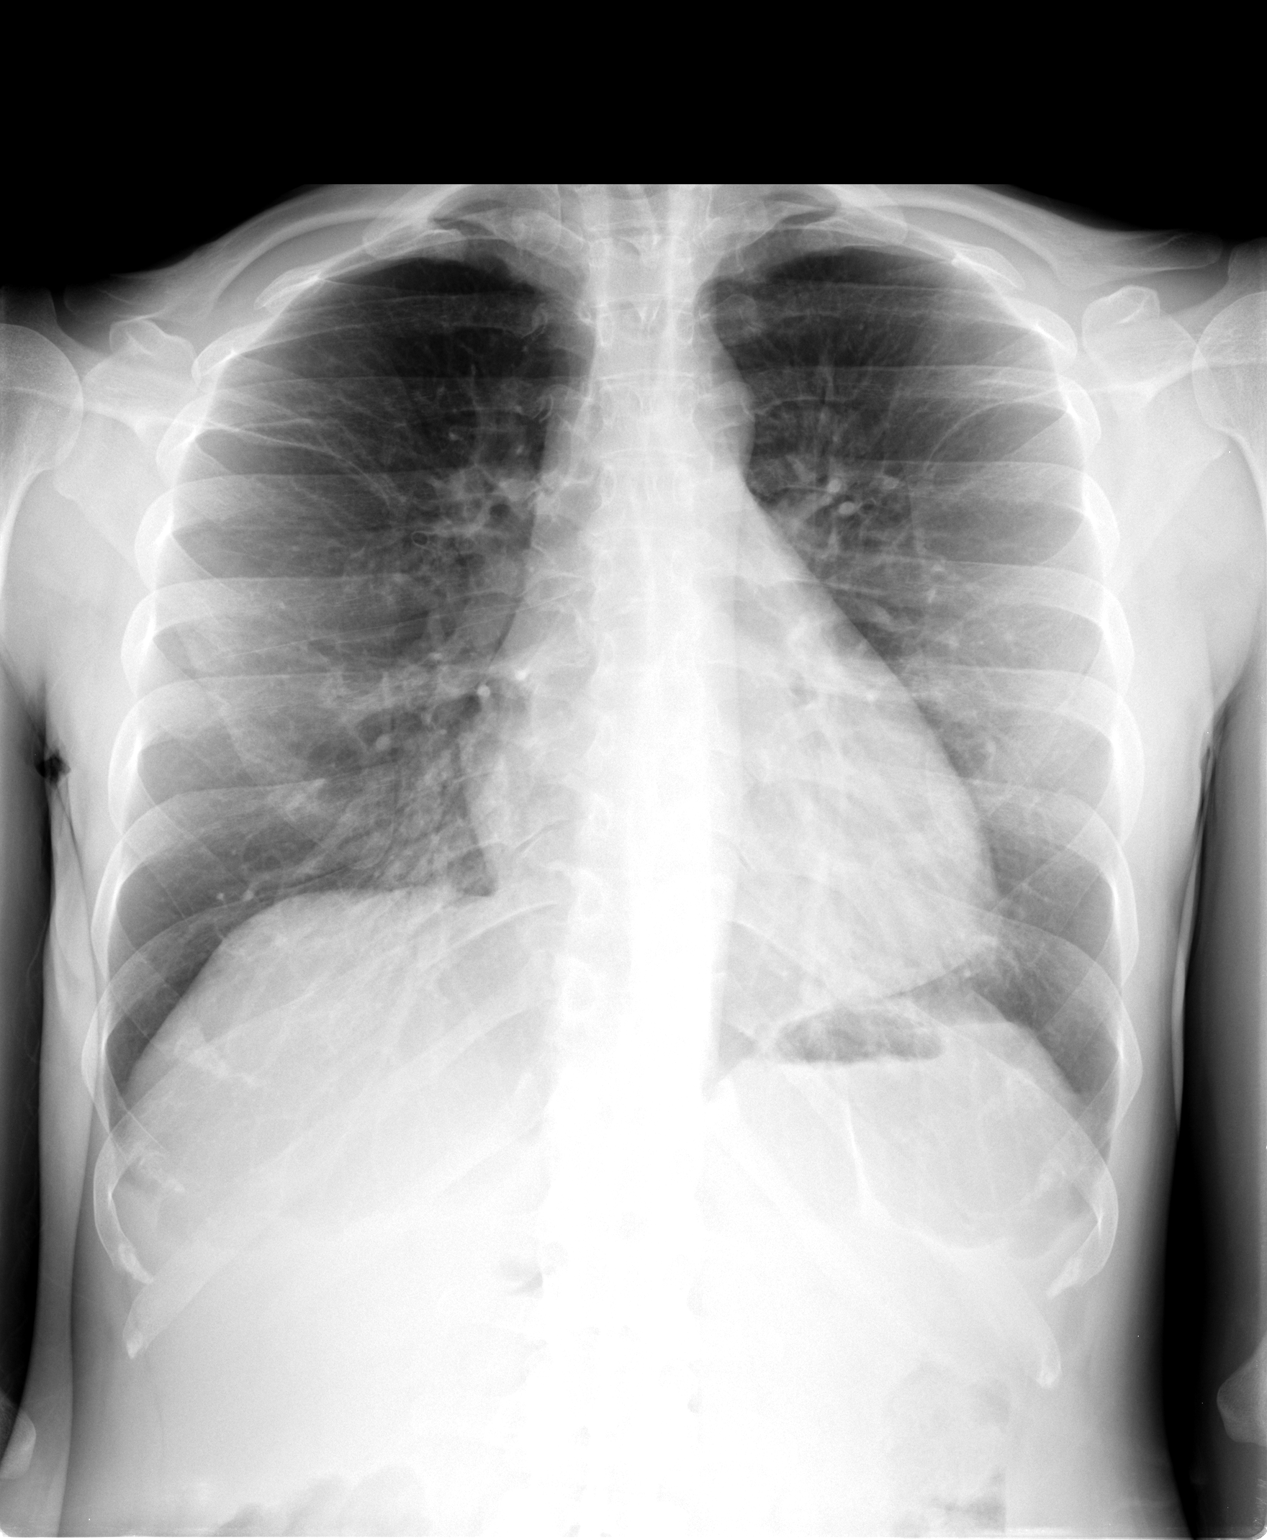

[1 of 1 positions shown; findings below may reference images not displayed]

FINDINGS: Apical lordotic view is compared to the chest x-ray from
yesterday.  In the right upper lobe, there is no suspicious
pulmonary nodule or mass demonstrated.  Prominent vascular markings
likely resulted in the previous chest x-ray finding.  No effusion
or pneumothorax.  Normal heart size and vascularity.  Minimal left
base atelectasis.
IMPRESSION: Left lower lobe atelectasis
No suspicious pulmonary nodule on the apical lordotic view.

## 2011-02-16 ENCOUNTER — Emergency Department (INDEPENDENT_AMBULATORY_CARE_PROVIDER_SITE_OTHER): Payer: Worker's Compensation

## 2011-02-16 ENCOUNTER — Encounter (HOSPITAL_BASED_OUTPATIENT_CLINIC_OR_DEPARTMENT_OTHER): Payer: Self-pay | Admitting: *Deleted

## 2011-02-16 ENCOUNTER — Emergency Department (HOSPITAL_BASED_OUTPATIENT_CLINIC_OR_DEPARTMENT_OTHER)
Admission: EM | Admit: 2011-02-16 | Discharge: 2011-02-16 | Disposition: A | Discharge status: Home / Self Care | Payer: Worker's Compensation | Attending: Emergency Medicine | Admitting: Emergency Medicine

## 2011-02-16 DIAGNOSIS — S335XXA Sprain of ligaments of lumbar spine, initial encounter: Secondary | ICD-10-CM | POA: Insufficient documentation

## 2011-02-16 DIAGNOSIS — M545 Low back pain: Secondary | ICD-10-CM

## 2011-02-16 DIAGNOSIS — M79609 Pain in unspecified limb: Secondary | ICD-10-CM

## 2011-02-16 DIAGNOSIS — X500XXA Overexertion from strenuous movement or load, initial encounter: Secondary | ICD-10-CM | POA: Insufficient documentation

## 2011-02-16 MED ORDER — OXYCODONE-ACETAMINOPHEN 5-325 MG PO TABS: 1.0000 | ORAL_TABLET | ORAL | Status: AC | PRN

## 2011-02-16 MED ORDER — FENTANYL CITRATE 0.05 MG/ML IJ SOLN
100.0000 ug | Freq: Once | INTRAMUSCULAR | Status: AC
  Administered 2011-02-16: 100 ug via INTRAVENOUS
  Filled 2011-02-16: qty 2

## 2011-02-16 MED ORDER — CYCLOBENZAPRINE HCL 10 MG PO TABS: 10.0000 mg | ORAL_TABLET | Freq: Two times a day (BID) | ORAL | Status: AC | PRN

## 2011-02-16 NOTE — ED Provider Notes (Signed)
History     CSN: [id number]  Arrival date & time 02/16/11  1251   First MD Initiated Contact with Patient 02/16/11 1346      Chief Complaint  Patient presents with  . Back Pain     HPI The patient was bending over at work when she developed sudden low back pain and spasm.  More prominent on the left side.  Associated with muscle spasm.  No real numbness or weakness.  No bowel or bladder incontinence.  Previous history of fractures.  No history of herniated disc. Past Medical History  Diagnosis Date  . ADVERSE DRUG REACTION 12/27/2008  . CHEST PAIN, PLEURITIC 03/26/2009  . Palpitations 10/30/2007  . PERIMENOPAUSAL SYNDROME 10/30/2007  . MVP (mitral valve prolapse)     Past Surgical History  Procedure Date  . Dilation and curettage of uterus     Family History  Problem Relation Age of Onset  . Hypertension Mother     History  Substance Use Topics  . Smoking status: Former Smoker    Quit date: 01/18/1998  . Smokeless tobacco: Never Used  . Alcohol Use: Yes    OB History    Grav Para Term Preterm Abortions TAB SAB Ect Mult Living                  Review of Systems Remaining review of systems negative except as noted in history of present illness Allergies  Erythromycin; Penicillin g benzathine; Sulfamethoxazole w/trimethoprim; and Tetracycline  Home Medications   Current Outpatient Rx  Name Route Sig Dispense Refill  . OXYCODONE-ACETAMINOPHEN 5-325 MG PO TABS Oral Take 1 tablet by mouth every 4 (four) hours as needed for pain. 20 tablet 0    BP 108/58  Pulse 55  Temp(Src) 97.7 F (36.5 C) (Oral)  Resp 20  SpO2 100%  Physical Exam  Nursing note and vitals reviewed. Constitutional: She is oriented to person, place, and time. She appears well-developed and well-nourished. No distress.  HENT:  Head: Normocephalic and atraumatic.  Eyes: Pupils are equal, round, and reactive to light.  Neck: Normal range of motion.  Cardiovascular: Normal rate and intact  distal pulses.   Pulmonary/Chest: No respiratory distress.  Abdominal: Normal appearance. She exhibits no distension. There is no tenderness. There is no rebound.  Musculoskeletal:       Lumbar back: She exhibits decreased range of motion, tenderness, pain and spasm.  Neurological: She is alert and oriented to person, place, and time. No cranial nerve deficit.  Skin: Skin is warm and dry. No rash noted.  Psychiatric: She has a normal mood and affect. Her behavior is normal.    ED Course  Procedures (including critical care time)  Labs Reviewed - No data to display Dg Lumbar Spine Complete  02/16/2011  *RADIOLOGY REPORT*  Clinical Data: Low back strain, pain radiating into the left lower extremity.  LUMBAR SPINE - COMPLETE 4+ VIEW 02/16/2011:  Comparison: Bone window images from CT abdomen and pelvis 06/25/2006.  Findings: Five non-rib bearing lumbar vertebra with anatomic alignment.  No visible fractures.  Well-preserved disc spaces.  No pars defects.  No significant facet arthropathy.  No evidence of spondylosis.  Visualized sacroiliac joints intact.  No significant interval change.  Note made of an intrauterine device in the pelvis.  IMPRESSION: Normal examination.  Original Report Authenticated By: Arnell Sieving, M.D.     1. Low back strain       MDM  Nelia Shi, MD 02/16/11 1450

## 2011-05-13 ENCOUNTER — Encounter: Payer: Self-pay | Admitting: Gynecology

## 2011-05-20 ENCOUNTER — Ambulatory Visit (INDEPENDENT_AMBULATORY_CARE_PROVIDER_SITE_OTHER): Payer: BC Managed Care – PPO | Admitting: Gynecology

## 2011-05-20 ENCOUNTER — Encounter: Payer: Self-pay | Admitting: Gynecology

## 2011-05-20 VITALS — BP 116/72 | Ht 63.5 in | Wt 130.0 lb

## 2011-05-20 DIAGNOSIS — N951 Menopausal and female climacteric states: Secondary | ICD-10-CM

## 2011-05-20 DIAGNOSIS — Z13220 Encounter for screening for lipoid disorders: Secondary | ICD-10-CM

## 2011-05-20 DIAGNOSIS — Z131 Encounter for screening for diabetes mellitus: Secondary | ICD-10-CM

## 2011-05-20 DIAGNOSIS — Z01419 Encounter for gynecological examination (general) (routine) without abnormal findings: Secondary | ICD-10-CM

## 2011-05-20 NOTE — Patient Instructions (Signed)
Office will contact you to schedule the x-ray studies and ultrasound due to your rib pain. Office will contact you in reference to your hormone levels.

## 2011-05-20 NOTE — Progress Notes (Signed)
Allison Lowery 07-25-1970 [id number]        41 y.o.  for annual exam.  Several issues noted below.  Past medical history,surgical history, medications, allergies, family history and social history were all reviewed and documented in the EPIC chart. ROS:  Was performed and pertinent positives and negatives are included in the history.  Exam: [name] chaperone present Filed Vitals:   05/20/11 1405  BP: 116/72   General appearance  Normal Skin grossly normal Head/Neck normal with no cervical or supraclavicular adenopathy thyroid normal Lungs  Clear. Area of tenderness left lower rib mid clavicular line without overlying skin changes palpable abnormalities or underlying abdominal masses/tenderness Cardiac RR, without RMG Abdominal  soft, nontender, without masses, organomegaly or hernia Breasts  examined lying and sitting without masses, retractions, discharge or axillary adenopathy. Pelvic  Ext/BUS/vagina  normal   Cervix  normal  IUD string visualized  Uterus  anteverted, normal size, shape and contour, midline and mobile nontender   Adnexa  Without masses or tenderness    Anus and perineum  normal   Rectovaginal  normal sphincter tone without palpated masses or tenderness.    Assessment/Plan:  41 y.o. female for annual exam.    1. Left rib pain. Patient notes left point tenderness on her rib as described above for several months when pushed on. Does not hurt otherwise and does not hurt with coughing or other movements.  We'll check rib films rule out tumor and abdominal ultrasound for any underlying abnormalities. 2. Night sweats. Patient continues to have night sweats that she has the last several years. They seem to be getting worse. Recheck her FSH TSH and estradiol level. Discussed even with normal values whether on a trial of low dose estrogen such as a patch or possible Effexor trial. We'll further discuss with lab results. 3. IUD. Mirena IUD doing well.  Placed September 2010.  Amenorrheic. IUD string visualized today. 4. Breast health. Mammogram last year 2012 normal. We'll repeat at age 70. SBE monthly reviewed. 5. Pap smear. No Pap smear was done today. Last Pap smear 2012. No history of abnormal Pap smears before with numerous normal reports in her chart. We'll plan every 3-5 your Pap smear. 6. Health maintenance. Baseline CBC lipid profile glucose and urinalysis ordered along with her other blood work.    Dara Lords MD, 2:45 PM 05/20/2011

## 2011-05-21 LAB — URINALYSIS W MICROSCOPIC + REFLEX CULTURE
Bacteria, UA: NONE SEEN
Casts: NONE SEEN
Glucose, UA: NEGATIVE mg/dL
Hgb urine dipstick: NEGATIVE
Ketones, ur: NEGATIVE mg/dL
Protein, ur: NEGATIVE mg/dL
pH: 6 (ref 5.0–8.0)

## 2011-05-21 LAB — CBC WITH DIFFERENTIAL/PLATELET
Basophils Absolute: 0 10*3/uL (ref 0.0–0.1)
Basophils Relative: 0 % (ref 0–1)
Eosinophils Relative: 2 % (ref 0–5)
HCT: 38.2 % (ref 36.0–46.0)
Hemoglobin: 12.2 g/dL (ref 12.0–15.0)
MCHC: 31.9 g/dL (ref 30.0–36.0)
MCV: 92 fL (ref 78.0–100.0)
Monocytes Absolute: 0.5 10*3/uL (ref 0.1–1.0)
Monocytes Relative: 12 % (ref 3–12)
Neutro Abs: 1.8 10*3/uL (ref 1.7–7.7)
RDW: 12.6 % (ref 11.5–15.5)

## 2011-05-21 LAB — LIPID PANEL
Cholesterol: 161 mg/dL (ref 0–200)
HDL: 67 mg/dL (ref >39)
Total CHOL/HDL Ratio: 2.4 Ratio
VLDL: 14 mg/dL (ref 0–40)

## 2011-05-21 LAB — GLUCOSE, RANDOM: Glucose, Bld: 93 mg/dL (ref 70–99)

## 2011-05-21 LAB — ESTRADIOL: Estradiol: 64.8 pg/mL

## 2011-05-21 LAB — TSH: TSH: 0.377 u[IU]/mL (ref 0.350–4.500)

## 2011-05-24 ENCOUNTER — Other Ambulatory Visit: Payer: Self-pay | Admitting: *Deleted

## 2011-05-24 ENCOUNTER — Telehealth: Payer: Self-pay | Admitting: *Deleted

## 2011-05-24 ENCOUNTER — Telehealth: Payer: Self-pay | Admitting: Gynecology

## 2011-05-24 DIAGNOSIS — R0781 Pleurodynia: Secondary | ICD-10-CM

## 2011-05-24 NOTE — Telephone Encounter (Signed)
Patient informed appt set up at [facility] on 05/31/11 @8 :30am.

## 2011-05-24 NOTE — Telephone Encounter (Signed)
Called patient and reviewed her lab results with her which were all normal. Her WBC is marginally low which it does fluctuate. Options for management to include low-dose estrogen, Effexor reviewed and she preferred just monitoring her symptoms now. She does have the ultrasound/x-rays for her rib pain schedule and she will follow up for those.

## 2011-05-24 NOTE — Telephone Encounter (Signed)
Message copied by Libby Maw on Mon May 24, 2011 11:02 AM ------      Message from: Dara Lords      Created: Thu May 20, 2011  2:50 PM       Schedule #1 rib films reference tender spot left anterior lowest rib midclavicular line #2 upper abdominal ultrasound reference pain as described above rule out underlying masses

## 2011-05-31 ENCOUNTER — Other Ambulatory Visit (HOSPITAL_COMMUNITY): Payer: BC Managed Care – PPO

## 2011-05-31 ENCOUNTER — Ambulatory Visit (HOSPITAL_COMMUNITY)
Admission: RE | Admit: 2011-05-31 | Discharge: 2011-05-31 | Disposition: A | Discharge status: Home / Self Care | Payer: BC Managed Care – PPO | Source: Ambulatory Visit | Attending: Gynecology | Admitting: Gynecology

## 2011-05-31 DIAGNOSIS — R0781 Pleurodynia: Secondary | ICD-10-CM

## 2011-05-31 DIAGNOSIS — R109 Unspecified abdominal pain: Secondary | ICD-10-CM | POA: Insufficient documentation

## 2011-05-31 DIAGNOSIS — R079 Chest pain, unspecified: Secondary | ICD-10-CM | POA: Insufficient documentation

## 2011-08-31 ENCOUNTER — Telehealth: Payer: Self-pay | Admitting: Internal Medicine

## 2011-08-31 NOTE — Telephone Encounter (Signed)
Caller: Keegan/Patient; Patient Name: Allison Lowery; PCP: Eleonore Chiquito; Best Callback Phone Number: [phone] Onset-08/28/11  Afebrile. Pt has started to have  head pressure and headache after exercising. Today she has a dull headache. Emergent s/s of Headache protocol r/o. Pt to see provider within 24hrs. Appt scheduled for tomorrow with Dr. Clent Ridges at 9:30am. PCP is not in office today or tomorrow and no openings for today.

## 2011-09-01 ENCOUNTER — Ambulatory Visit (INDEPENDENT_AMBULATORY_CARE_PROVIDER_SITE_OTHER)
Admission: RE | Admit: 2011-09-01 | Discharge: 2011-09-01 | Disposition: A | Discharge status: Home / Self Care | Payer: BC Managed Care – PPO | Source: Ambulatory Visit | Attending: Family Medicine | Admitting: Family Medicine

## 2011-09-01 ENCOUNTER — Encounter: Payer: Self-pay | Admitting: Family Medicine

## 2011-09-01 ENCOUNTER — Ambulatory Visit (INDEPENDENT_AMBULATORY_CARE_PROVIDER_SITE_OTHER): Payer: BC Managed Care – PPO | Admitting: Family Medicine

## 2011-09-01 VITALS — BP 118/80 | Temp 98.4°F | Wt 132.0 lb

## 2011-09-01 DIAGNOSIS — R51 Headache: Secondary | ICD-10-CM

## 2011-09-01 NOTE — Progress Notes (Signed)
  Subjective:    Patient ID: Allison Lowery, female    DOB: 1970/10/13, 41 y.o.   MRN: [id number]  HPI Here for the onset of severe headaches one week ago. She had migraines as a teenager but these resolved. Her current headaches are nothing like her old migraines however. She has had 3 of these HAs in the past week, the last one was yesterday. Each of these came on immediately after exercise. She does a routine of light weight work and then runs a mile or so. She feels fine during the exercise but as soon as she sits down to rest, the HA starts. This is centered on the top of the head. It has a throbbing quality and is severe. They make her nauseated but she has not vomited. No vision changes and no other neurologic deficits. The HA lasts about 15 minutes an then promptly goes away. After that she feels totally fine. Today she feels fine. She is very worried of course about the source of these HAs. She is on ne meds except her IUD, which has been in place for 3 years. No recent trauma.    Review of Systems  Constitutional: Negative.   Eyes: Negative.   Respiratory: Negative.   Cardiovascular: Negative.   Neurological: Positive for headaches. Negative for dizziness, tremors, seizures, syncope, facial asymmetry, speech difficulty, weakness, light-headedness and numbness.       Objective:   Physical Exam  Constitutional: She is oriented to person, place, and time. She appears well-developed and well-nourished. No distress.  HENT:  Head: Normocephalic and atraumatic.  Eyes: Conjunctivae and EOM are normal. Pupils are equal, round, and reactive to light.  Neck: No thyromegaly present.  Cardiovascular: Normal rate, regular rhythm, normal heart sounds and intact distal pulses.   Pulmonary/Chest: Effort normal and breath sounds normal.  Lymphadenopathy:    She has no cervical adenopathy.  Neurological: She is alert and oriented to person, place, and time. No cranial nerve deficit. She exhibits  normal muscle tone. Coordination normal.          Assessment & Plan:  It is unclear what type of HA this may be, although it does sound like a cluster HA by the rapid onset and the rapid resolution of them. Migraines are less likely. We will set up a head CT today to rule out structural abnormalities. I did not prescribe any meds since the HAs resolve so quickly on their own.

## 2011-09-01 NOTE — Progress Notes (Signed)
Quick Note:  Attempt to call - Vm LMTCB if questions CT normal ______

## 2011-10-29 ENCOUNTER — Telehealth: Payer: Self-pay | Admitting: *Deleted

## 2011-10-29 NOTE — Telephone Encounter (Signed)
Highly unlikely that this would represent cancer. 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 Alternative would be to do a needle biopsy now for histology to prove that it is a fibroadenoma, while leaving the mass it would avoid a larger incision to excise which I would not recommend doing at this time. Occasionally young girls can have a breast bud which is normal developing breast tissue that can feel very hard and if you remove it it would inhibit breast development. I would be happy to see her and examine her if Yoanna wants but otherwise I think follow up ultrasound in 6 months sounds reasonable.

## 2011-10-29 NOTE — Telephone Encounter (Signed)
Pt informed with all the below note, she decided to wait 6 month and repeat ultrasound for her daughter.

## 2011-10-29 NOTE — Telephone Encounter (Signed)
Pt is calling to get your opinion about her 74 year daughter. Pt said on Saturday her daughter found lump in left breast, Monday daughter went to the pediatrician. 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 Pt understands her daughter is not your pt, but would like you opinion what should she do about her child? Please advise

## 2012-02-29 ENCOUNTER — Encounter: Payer: Self-pay | Admitting: Internal Medicine

## 2012-02-29 ENCOUNTER — Ambulatory Visit (INDEPENDENT_AMBULATORY_CARE_PROVIDER_SITE_OTHER): Payer: BC Managed Care – PPO | Admitting: Internal Medicine

## 2012-02-29 VITALS — BP 110/70 | HR 64 | Temp 98.8°F | Resp 18 | Wt 137.0 lb

## 2012-02-29 DIAGNOSIS — M79644 Pain in right finger(s): Secondary | ICD-10-CM

## 2012-02-29 DIAGNOSIS — M79609 Pain in unspecified limb: Secondary | ICD-10-CM

## 2012-02-29 NOTE — Progress Notes (Signed)
  Subjective:    Patient ID: Allison Lowery, female    DOB: 1970-07-22, 42 y.o.   MRN: [id number]  HPI  42 year old patient who presents with a one-week history of pain involving the distal right third finger. There's been no obvious trauma she also is described some mild numbness involving the distal finger. There's been no excessive erythema or heat but there has been some mild local tenderness. 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 ADVERSE DRUG REACTION 12/27/2008  . CHEST PAIN, PLEURITIC 03/26/2009  . Palpitations 10/30/2007  . PERIMENOPAUSAL SYNDROME 10/30/2007  . MVP (mitral valve prolapse)   . IUD 09/2008    MIRENA INSERTED 09/2003 AND REPLACED IN 09/2008    History   Social History  . Marital Status: Married    Spouse Name: N/A    Number of Children: N/A  . Years of Education: N/A   Occupational History  . Not on file.   Social History Main Topics  . Smoking status: Former Smoker    Quit date: 01/18/1998  . Smokeless tobacco: Never Used  . Alcohol Use: Yes  . Drug Use: No  . Sexually Active: Yes    Birth Control/ Protection: IUD   Other Topics Concern  . Not on file   Social History Narrative  . No narrative on file    Past Surgical History  Procedure Laterality Date  . Dilation and curettage of uterus    . Bladder suspension  07/04/2008    DR. SCOTT MACDIARMID  . Intrauterine device insertion  09/2008    mirena    Family History  Problem Relation Age of Onset  . Hypertension Mother     Allergies  Allergen Reactions  . Amoxicillin   . Erythromycin   . Penicillin G Benzathine   . Sulfamethoxazole W-Trimethoprim   . Tetracycline     Current Outpatient Prescriptions on File Prior to Visit  Medication Sig Dispense Refill  . fish oil-omega-3 fatty acids 1000 MG capsule Take 2 g by mouth daily.      Marland Kitchen levonorgestrel (MIRENA) 20 MCG/24HR IUD 1 each by  Intrauterine route once. INSERTED 09/2008.       No current facility-administered medications on file prior to visit.    BP 110/70  Pulse 64  Temp(Src) 98.8 F (37.1 C) (Oral)  Resp 18  Wt 137 lb (62.143 kg)  BMI 23.88 kg/m2       Review of Systems  Musculoskeletal: Arthralgias:  pain right third finger distally.       Objective:   Physical Exam  Constitutional: She appears well-developed and well-nourished. No distress.  Musculoskeletal:  The right third finger was examined. There is no tenderness or swelling about the PIP or DIP joint;  the area between these joints was slightly pale and minimally edematous. There is mild tenderness to palpation. The distal finger appeared normal;  radial and ulnar pulses were normal. No ischemic changes were appreciated          Assessment & Plan:   Right third finger pain. Unclear etiology probable mild tendinitis involving the extensor tendon. Will avoid overuse and observe at this time. The patient wishes to avoid medications such as anti-inflammatories in view of her history of severe adverse drug reaction. Will call if there is no clinical improvement

## 2012-02-29 NOTE — Patient Instructions (Signed)
Call or return to clinic prn if these symptoms worsen or fail to improve as anticipated.

## 2012-06-26 ENCOUNTER — Encounter: Payer: Self-pay | Admitting: Gynecology

## 2012-06-26 ENCOUNTER — Ambulatory Visit (INDEPENDENT_AMBULATORY_CARE_PROVIDER_SITE_OTHER): Payer: BC Managed Care – PPO | Admitting: Gynecology

## 2012-06-26 DIAGNOSIS — N6001 Solitary cyst of right breast: Secondary | ICD-10-CM

## 2012-06-26 DIAGNOSIS — N6489 Other specified disorders of breast: Secondary | ICD-10-CM

## 2012-06-26 DIAGNOSIS — N6009 Solitary cyst of unspecified breast: Secondary | ICD-10-CM

## 2012-06-26 NOTE — Progress Notes (Addendum)
Patient ID: Allison Lowery, female   DOB: [birth year], 42 y.o.   MRN: [id number] Patient presents having felt a tender mass in her right breast last night. No history of this before. Baseline mammogram 2012 normal.  Exam with [name] Left breast without masses, retractions, discharge, adenopathy. Right breast with 1.5 cm cystic feeling firm mass 4:00 position off areola. No overlying skin changes nipple discharge other masses or axillary adenopathy.  Physical Exam  Pulmonary/Chest:      Procedure: Skin area overlying the mass was cleansed with alcohol, 1% lidocaine infiltration and subsequently 2 cc of clear to milky fluid removed and discarded. Mass completely disappeared with no palpable residual.   Assessment and plan: Right breast cyst, aspirated and resolved. Patient will continue self breast exams as long as no palpable abnormality she'll continue to follow. She is due for her mammogram now have asked her to wait one month to allow resolution of any post aspiration changes. Then to go ahead and get her mammogram. Assuming negative then we'll follow expectantly. If any recurrence or any new findings on self breast exam she'll represent. Patient actually has her annual schedule beginning of next month and she'll followup for this.

## 2012-06-26 NOTE — Patient Instructions (Signed)
Schedule routine mammogram in 1-2 months. Continue with self breast exams and if you feel that area reappear in the right breast than followup for reevaluation. Followup for your annual exam as scheduled next month.

## 2012-07-28 ENCOUNTER — Ambulatory Visit (INDEPENDENT_AMBULATORY_CARE_PROVIDER_SITE_OTHER): Payer: BC Managed Care – PPO | Admitting: Gynecology

## 2012-07-28 ENCOUNTER — Encounter: Payer: Self-pay | Admitting: Gynecology

## 2012-07-28 VITALS — BP 118/70 | Ht 63.5 in | Wt 129.0 lb

## 2012-07-28 DIAGNOSIS — Z01419 Encounter for gynecological examination (general) (routine) without abnormal findings: Secondary | ICD-10-CM

## 2012-07-28 DIAGNOSIS — Z13220 Encounter for screening for lipoid disorders: Secondary | ICD-10-CM

## 2012-07-28 DIAGNOSIS — Z30431 Encounter for routine checking of intrauterine contraceptive device: Secondary | ICD-10-CM

## 2012-07-28 NOTE — Patient Instructions (Addendum)
Arrange for your mammogram. Return for fasting blood work. Followup in one year for annual exam.

## 2012-07-28 NOTE — Progress Notes (Signed)
Allison Lowery 26-Apr-1970 [id number]        42 y.o.  [id number] for annual exam.  Doing well without complaints.  Past medical history,surgical history, medications, allergies, family history and social history were all reviewed and documented in the EPIC chart.  ROS:  Performed and pertinent positives and negatives are included in the history, assessment and plan .  Exam: Biomedical scientist Filed Vitals:   07/28/12 1437  BP: 118/70  Height: 5' 3.5" (1.613 m)  Weight: 129 lb (58.514 kg)   General appearance  Normal Skin grossly normal Head/Neck normal with no cervical or supraclavicular adenopathy thyroid normal Lungs  clear Cardiac RR, without RMG Abdominal  soft, nontender, without masses, organomegaly or hernia Breasts  examined lying and sitting without masses, retractions, discharge or axillary adenopathy. Pelvic  Ext/BUS/vagina  normal   Cervix  normal with IUD string visualized  Uterus  retroverted, normal size, shape and contour, midline and mobile nontender   Adnexa  Without masses or tenderness    Anus and perineum  normal   Rectovaginal  normal sphincter tone without palpated masses or tenderness.    Assessment/Plan:  42 y.o. [name] female for annual exam.   1. Mirena IUD 09/2008. Due to be replaced next year. Amenorrheic doing well, IUD string visualized. 2. Recent breast aspiration. Breast exam today is normal without palpable masses bilaterally. She did have a small nodule in the left breast previously but this is resolved. Is planning for mammography sometime in the next month or 2. SBE monthly reviewed. 3. Pap smear 2012. No Pap smear done today. No history of significant abnormal Pap smears. Repeat next year at 3 year interval. 4. Health maintenance. Patient to return for a fasting CBC comprehensive metabolic panel lipid profile. Urinalysis today. Followup in one year, sooner as needed  Note: This document was prepared with digital dictation and possible smart  phrase technology. Any transcriptional errors that result from this process are unintentional.   Dara Lords MD, 3:03 PM 07/28/2012

## 2012-07-29 LAB — URINALYSIS W MICROSCOPIC + REFLEX CULTURE
Bacteria, UA: NONE SEEN
Hgb urine dipstick: NEGATIVE
Ketones, ur: NEGATIVE mg/dL
Nitrite: NEGATIVE
Protein, ur: NEGATIVE mg/dL
Urobilinogen, UA: 0.2 mg/dL (ref 0.0–1.0)

## 2012-08-02 ENCOUNTER — Other Ambulatory Visit: Payer: BC Managed Care – PPO

## 2012-08-02 LAB — COMPREHENSIVE METABOLIC PANEL
ALT: 75 U/L — ABNORMAL HIGH (ref 0–35)
AST: 203 U/L — ABNORMAL HIGH (ref 0–37)
Albumin: 4.2 g/dL (ref 3.5–5.2)
BUN: 12 mg/dL (ref 6–23)
Calcium: 9.3 mg/dL (ref 8.4–10.5)
Chloride: 104 mEq/L (ref 96–112)
Potassium: 4.9 mEq/L (ref 3.5–5.3)

## 2012-08-02 LAB — CBC WITH DIFFERENTIAL/PLATELET
Basophils Absolute: 0 10*3/uL (ref 0.0–0.1)
HCT: 34.7 % — ABNORMAL LOW (ref 36.0–46.0)
Hemoglobin: 12.1 g/dL (ref 12.0–15.0)
Lymphocytes Relative: 21 % (ref 12–46)
Lymphs Abs: 1.2 10*3/uL (ref 0.7–4.0)
Monocytes Absolute: 0.6 10*3/uL (ref 0.1–1.0)
Neutro Abs: 4.1 10*3/uL (ref 1.7–7.7)
RBC: 4.1 MIL/uL (ref 3.87–5.11)
RDW: 12.9 % (ref 11.5–15.5)
WBC: 6 10*3/uL (ref 4.0–10.5)

## 2012-08-02 LAB — LIPID PANEL: HDL: 61 mg/dL (ref >39)

## 2012-08-03 ENCOUNTER — Other Ambulatory Visit: Payer: Self-pay | Admitting: Gynecology

## 2012-08-04 ENCOUNTER — Other Ambulatory Visit: Payer: Self-pay | Admitting: Gynecology

## 2012-08-04 DIAGNOSIS — R7989 Other specified abnormal findings of blood chemistry: Secondary | ICD-10-CM

## 2012-08-07 ENCOUNTER — Other Ambulatory Visit: Payer: BC Managed Care – PPO

## 2012-08-07 ENCOUNTER — Other Ambulatory Visit: Payer: Self-pay | Admitting: Gynecology

## 2012-08-07 ENCOUNTER — Telehealth: Payer: Self-pay

## 2012-08-07 DIAGNOSIS — R7989 Other specified abnormal findings of blood chemistry: Secondary | ICD-10-CM

## 2012-08-07 DIAGNOSIS — D696 Thrombocytopenia, unspecified: Secondary | ICD-10-CM

## 2012-08-07 DIAGNOSIS — R748 Abnormal levels of other serum enzymes: Secondary | ICD-10-CM

## 2012-08-07 DIAGNOSIS — D649 Anemia, unspecified: Secondary | ICD-10-CM

## 2012-08-07 DIAGNOSIS — D72819 Decreased white blood cell count, unspecified: Secondary | ICD-10-CM

## 2012-08-07 LAB — HEPATIC FUNCTION PANEL
ALT: 37 U/L — ABNORMAL HIGH (ref 0–35)
Bilirubin, Direct: 0.2 mg/dL (ref 0.0–0.3)
Indirect Bilirubin: 0.4 mg/dL (ref 0.0–0.9)

## 2012-08-07 LAB — CBC WITH DIFFERENTIAL/PLATELET
Basophils Absolute: 0 10*3/uL (ref 0.0–0.1)
HCT: 32 % — ABNORMAL LOW (ref 36.0–46.0)
Hemoglobin: 11.1 g/dL — ABNORMAL LOW (ref 12.0–15.0)
Lymphocytes Relative: 27 % (ref 12–46)
Monocytes Absolute: 0.4 10*3/uL (ref 0.1–1.0)
Neutro Abs: 2.2 10*3/uL (ref 1.7–7.7)
RDW: 12.9 % (ref 11.5–15.5)
WBC: 3.7 10*3/uL — ABNORMAL LOW (ref 4.0–10.5)

## 2012-08-07 NOTE — Telephone Encounter (Signed)
Patient said that she recently had colonoscopy done and Dr. Marisue Brooklyn told her she had ischemic colitis  She wondered if these results could be a result of that or affected by that?   She also had labs done there about a month ago. She wondered if you might want to consult with Dr. Loreta Ave about this and let her know.

## 2012-08-07 NOTE — Telephone Encounter (Signed)
Message copied by Keenan Bachelor on Mon Aug 07, 2012  3:38 PM ------      Message from: Dara Lords      Created: Mon Aug 07, 2012  3:03 PM       Tell patient that her liver functions are better but not normal. Her white count is slightly low as is her hemoglobin and her platelet count is also low. Assuming that she otherwise feels well then I would recommend waiting and repeating both the CBC and liver panel in several weeks. If they continue to be abnormal then going to refer her to an internist for further evaluation. ------

## 2012-08-10 NOTE — Telephone Encounter (Signed)
Patient called. Informed. Labs faxed to Dr. Loreta Ave.

## 2012-08-10 NOTE — Telephone Encounter (Signed)
Left message for patient to call.

## 2012-08-10 NOTE — Telephone Encounter (Signed)
Tell patient that I spoke with Dr. Loreta Ave. She would like to see the patient. Have her call and make an appointment to see Dr. Loreta Ave. Fax a copy of her lab work to Dr. Loreta Ave.

## 2012-09-05 ENCOUNTER — Encounter: Payer: Self-pay | Admitting: Gastroenterology

## 2012-11-23 ENCOUNTER — Other Ambulatory Visit: Payer: Self-pay

## 2013-02-13 IMAGING — CR DG RIBS W/ CHEST 3+V*L*
3 series · 3 of 3 positions shown · non-contrast
Comparison: 03/27/2009; 03/26/2009; 12/22/2008

CLINICAL DATA: Unexplained left anterior rib cage pain for 9 weeks,
no trauma

LEFT RIBS AND CHEST - 3+ VIEW

[view not recorded (1 of 3)]
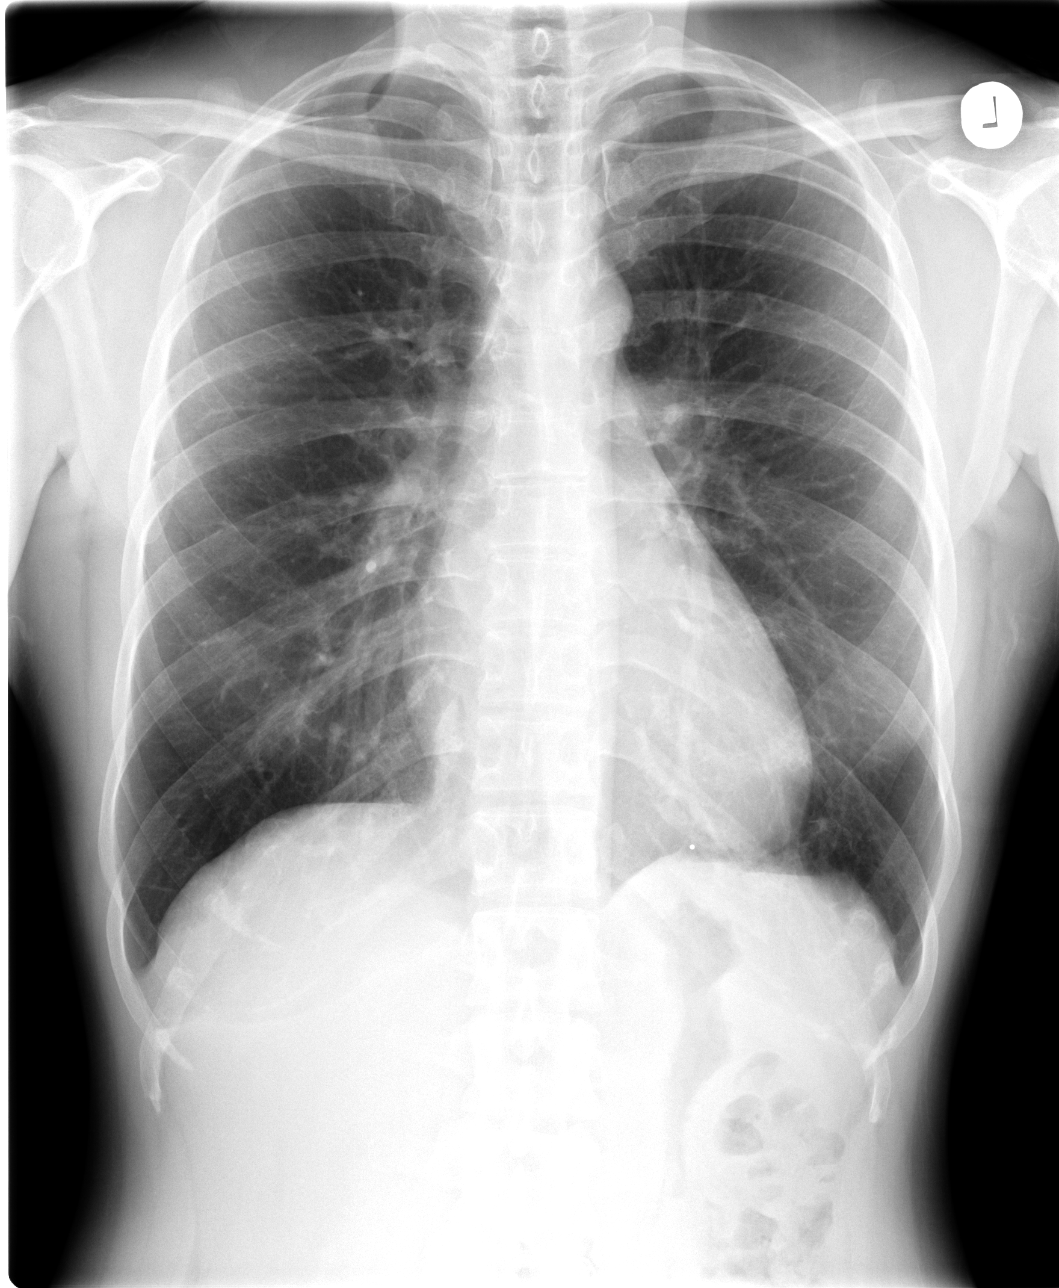

[view not recorded (2 of 3)]
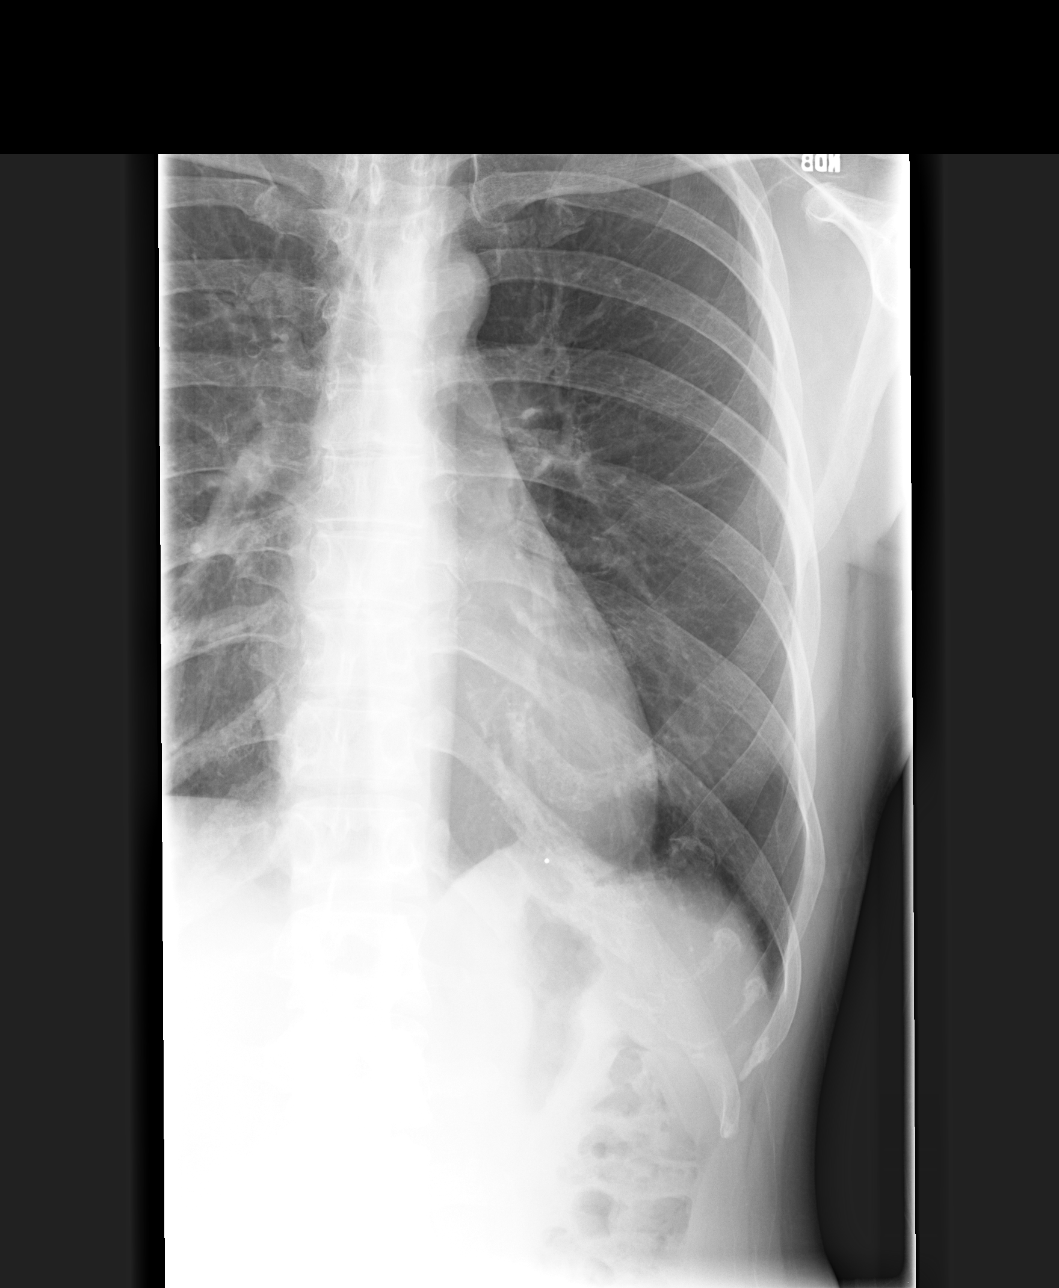

[view not recorded (3 of 3)]
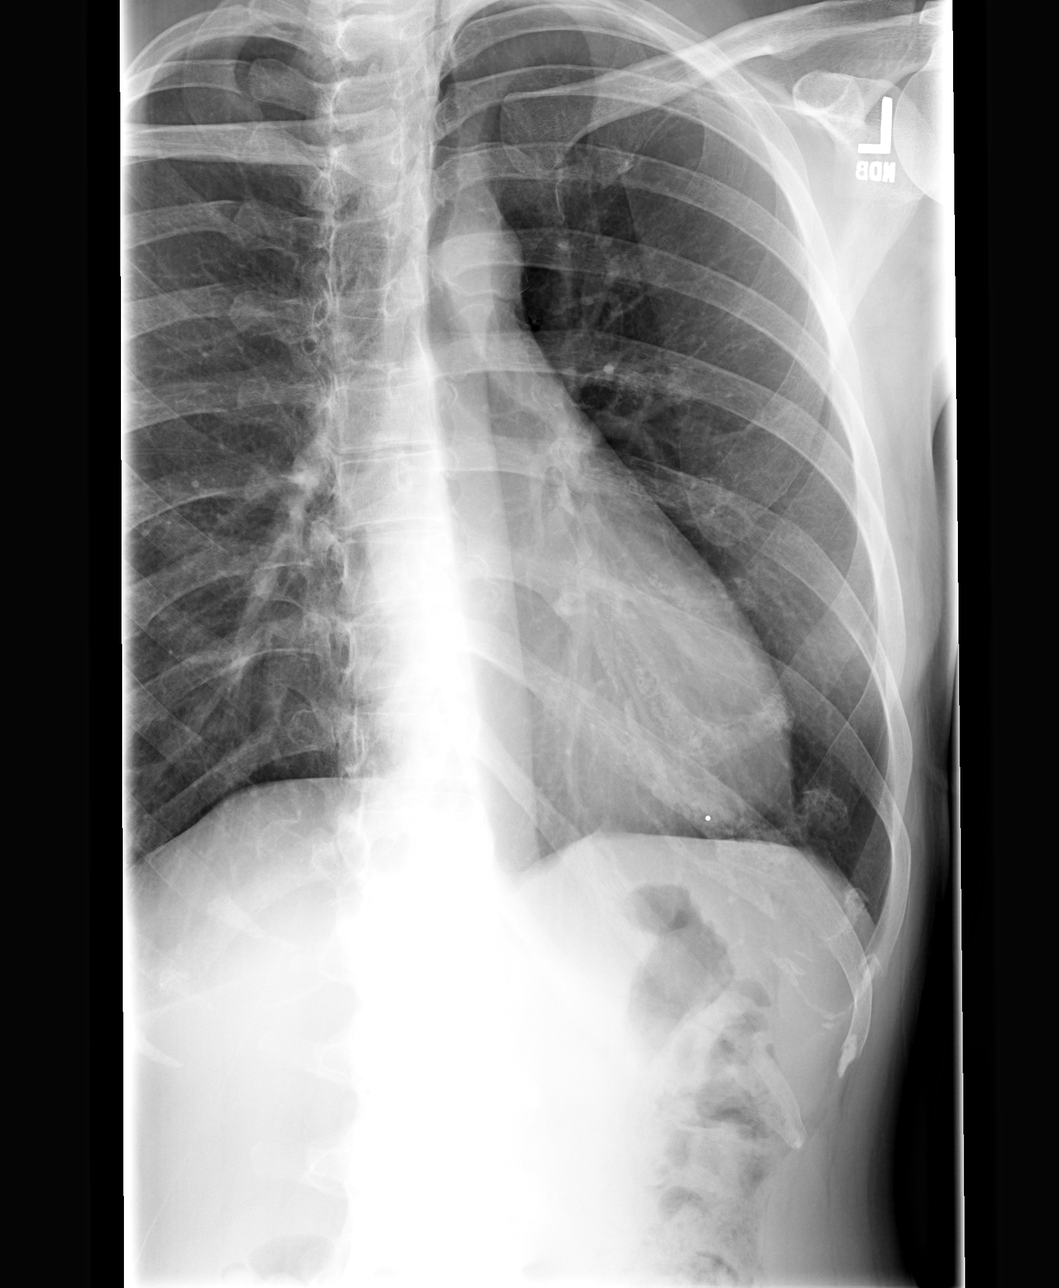

[3 of 3 positions shown; findings below may reference images not displayed]

FINDINGS: Grossly unchanged cardiac silhouette and mediastinal contours.  No
focal airspace opacities.  No pleural effusion or pneumothorax.

No displaced rib fractures with special attention paid to the area
demarcated by the radiopaque BB which correlates with the anterior
aspect of the left sixth rib.
IMPRESSION: No acute cardiopulmonary disease, specifically, no displaced rib
fractures with special attention paid to the area demarcated by the
radiopaque BB.

## 2013-03-19 ENCOUNTER — Other Ambulatory Visit: Payer: Self-pay

## 2013-03-19 DIAGNOSIS — Z1231 Encounter for screening mammogram for malignant neoplasm of breast: Secondary | ICD-10-CM

## 2013-04-02 ENCOUNTER — Ambulatory Visit
Admission: RE | Admit: 2013-04-02 | Discharge: 2013-04-02 | Disposition: A | Discharge status: Home / Self Care | Payer: BC Managed Care – PPO | Source: Ambulatory Visit

## 2013-04-02 DIAGNOSIS — Z1231 Encounter for screening mammogram for malignant neoplasm of breast: Secondary | ICD-10-CM

## 2013-08-23 ENCOUNTER — Encounter: Payer: BC Managed Care – PPO | Admitting: Gynecology

## 2013-10-19 ENCOUNTER — Encounter: Payer: BC Managed Care – PPO | Admitting: Gynecology

## 2013-11-09 ENCOUNTER — Other Ambulatory Visit (HOSPITAL_COMMUNITY)
Admission: RE | Admit: 2013-11-09 | Discharge: 2013-11-09 | Disposition: A | Discharge status: Home / Self Care | Payer: BC Managed Care – PPO | Source: Ambulatory Visit | Attending: Gynecology | Admitting: Gynecology

## 2013-11-09 ENCOUNTER — Ambulatory Visit (INDEPENDENT_AMBULATORY_CARE_PROVIDER_SITE_OTHER): Payer: BC Managed Care – PPO | Admitting: Gynecology

## 2013-11-09 ENCOUNTER — Encounter: Payer: Self-pay | Admitting: Gynecology

## 2013-11-09 VITALS — BP 116/70 | Ht 63.5 in | Wt 130.0 lb

## 2013-11-09 DIAGNOSIS — Z1151 Encounter for screening for human papillomavirus (HPV): Secondary | ICD-10-CM | POA: Insufficient documentation

## 2013-11-09 DIAGNOSIS — Z23 Encounter for immunization: Secondary | ICD-10-CM

## 2013-11-09 DIAGNOSIS — Z30431 Encounter for routine checking of intrauterine contraceptive device: Secondary | ICD-10-CM

## 2013-11-09 DIAGNOSIS — Z01419 Encounter for gynecological examination (general) (routine) without abnormal findings: Secondary | ICD-10-CM

## 2013-11-09 NOTE — Addendum Note (Signed)
Addended by: Nelva Nay on: 11/09/2013 03:24 PM   Modules accepted: Orders

## 2013-11-09 NOTE — Progress Notes (Signed)
Allison Lowery 1970/11/20 [id number]        43 y.o.  [id number] for annual exam.  Several issues noted below.  Past medical history,surgical history, problem list, medications, allergies, family history and social history were all reviewed and documented as reviewed in the EPIC chart.  ROS:  12 system ROS performed with pertinent positives and negatives included in the history, assessment and plan.   Additional significant findings :  none   Exam: Kim Counsellor Vitals:   11/09/13 1358  BP: 116/70  Height: 5' 3.5" (1.613 m)  Weight: 130 lb (58.968 kg)   General appearance:  Normal affect, orientation and appearance. Skin: Grossly normal HEENT: Without gross lesions.  No cervical or supraclavicular adenopathy. Thyroid normal.  Lungs:  Clear without wheezing, rales or rhonchi Cardiac: RR, without RMG Abdominal:  Soft, nontender, without masses, guarding, rebound, organomegaly or hernia Breasts:  Examined lying and sitting without masses, retractions, discharge or axillary adenopathy. Pelvic:  Ext/BUS/vagina normal  Cervix normal with IUD string visualized. Pap/HPV  Uterus anteverted, normal size, shape and contour, midline and mobile nontender   Adnexa  Without masses or tenderness    Anus and perineum  Normal   Rectovaginal  Normal sphincter tone without palpated masses or tenderness.    Assessment/Plan:  43 y.o. [id number] female for annual exam without menses, Mirena IUD.   1. Mirena IUD 09/2008. Patient knows she is overdue to have it replaced. Patient actually wants to proceed with the tubal sterilization. I reviewed all forms of contraception with her. I reviewed pill patch ring Nexplanon Depo-Provera IUD and sterilization. I discussed vasectomy Essure and laparoscopic tubal sterilization.  Patient wants to proceed with laparoscopic tubal sterilization. I reviewed the SGO recommendations for prophylactic salpingectomy for a risk reduction surgery for "ovarian cancer". She  would like to do this if possible but would like to check with her insurance to make sure they cover this. If they do not then she will proceed with a standard Falope ring or cautery type tubal sterilization. I reviewed what is involved with the surgery to include the expected intraoperative/postoperative courses in the recovery period. The absolute irreversibility of sterilization discussed the possibility of failure. The risks of infection, hemorrhage necessitating transfusion and the risks of transfusion to include transfusion reaction, hepatitis, HIV, mad cow disease and other unknown entities reviewed. The risk of inadvertent injury to internal organs including bowel, bladder, ureters, vessels, nerves necessitating major exploratory reparative surgeries and future reparative surgeries including bowel resection, ostomy formation, bladder repair and ureteral damage repair was all discussed with her. Incisional complications including opening and draining of incisions and closure by secondary intention as well as long-term issues of keloid/cosmetics and hernia formation were also discussed. The patient wants to proceed mobile and schedule this for her.  I reviewed with her that her IUD is overdo and that she could be at increased risk for pregnancy although recent studies indicate that the efficacy probably exceeds 5 years and she elects to keep the IUD through her sterilization which she plans to do same. If she elects not to proceed with this then she knows she needs to come in and have her IUD replaced. 2. Mammography 03/2013. Continue with annual mammography. SBE monthly reviewed. She does have dense breasts I did recommend 3-D mammography for her next mammogram. 3. Pap smear 2012. Pap/HPVdone today.  No history of abnormal Pap smears previously. Repeat in 3-5 year interval assuming this Pap smear is normal per current screening guidelines. 4.  Requests tetanus vaccination. DTaP given. 5. Requests hepatitis  B antibody levels for work. 6. Health maintenance. Patient is not fasting and future orders were placed for CBC comprehensive metabolic panel lipid profile urinalysis. 7.     Anastasio Auerbach MD, 2:32 PM 11/09/2013

## 2013-11-09 NOTE — Patient Instructions (Signed)
Office will contact you to arrange for tubal sterilization.  You may obtain a copy of any labs that were done today by logging onto MyChart as outlined in the instructions provided with your AVS (after visit summary). The office will not call with normal lab results but certainly if there are any significant abnormalities then we will contact you.   Health Maintenance, Female A healthy lifestyle and preventative care can promote health and wellness.  Maintain regular health, dental, and eye exams.  Eat a healthy diet. Foods like vegetables, fruits, whole grains, low-fat dairy products, and lean protein foods contain the nutrients you need without too many calories. Decrease your intake of foods high in solid fats, added sugars, and salt. Get information about a proper diet from your caregiver, if necessary.  Regular physical exercise is one of the most important things you can do for your health. Most adults should get at least 150 minutes of moderate-intensity exercise (any activity that increases your heart rate and causes you to sweat) each week. In addition, most adults need muscle-strengthening exercises on 2 or more days a week.   Maintain a healthy weight. The body mass index (BMI) is a screening tool to identify possible weight problems. It provides an estimate of body fat based on height and weight. Your caregiver can help determine your BMI, and can help you achieve or maintain a healthy weight. For adults 20 years and older:  A BMI below 18.5 is considered underweight.  A BMI of 18.5 to 24.9 is normal.  A BMI of 25 to 29.9 is considered overweight.  A BMI of 30 and above is considered obese.  Maintain normal blood lipids and cholesterol by exercising and minimizing your intake of saturated fat. Eat a balanced diet with plenty of fruits and vegetables. Blood tests for lipids and cholesterol should begin at age 74 and be repeated every 5 years. If your lipid or cholesterol levels are  high, you are over 50, or you are a high risk for heart disease, you may need your cholesterol levels checked more frequently.Ongoing high lipid and cholesterol levels should be treated with medicines if diet and exercise are not effective.  If you smoke, find out from your caregiver how to quit. If you do not use tobacco, do not start.  Lung cancer screening is recommended for adults aged 39 80 years who are at high risk for developing lung cancer because of a history of smoking. Yearly low-dose computed tomography (CT) is recommended for people who have at least a 30-pack-year history of smoking and are a current smoker or have quit within the past 15 years. A pack year of smoking is smoking an average of 1 pack of cigarettes a day for 1 year (for example: 1 pack a day for 30 years or 2 packs a day for 15 years). Yearly screening should continue until the smoker has stopped smoking for at least 15 years. Yearly screening should also be stopped for people who develop a health problem that would prevent them from having lung cancer treatment.  If you are pregnant, do not drink alcohol. If you are breastfeeding, be very cautious about drinking alcohol. If you are not pregnant and choose to drink alcohol, do not exceed 1 drink per day. One drink is considered to be 12 ounces (355 mL) of beer, 5 ounces (148 mL) of wine, or 1.5 ounces (44 mL) of liquor.  Avoid use of street drugs. Do not share needles with anyone. Ask  for help if you need support or instructions about stopping the use of drugs.  High blood pressure causes heart disease and increases the risk of stroke. Blood pressure should be checked at least every 1 to 2 years. Ongoing high blood pressure should be treated with medicines, if weight loss and exercise are not effective.  If you are 59 to 41 years old, ask your caregiver if you should take aspirin to prevent strokes.  Diabetes screening involves taking a blood sample to check your fasting  blood sugar level. This should be done once every 3 years, after age 71, if you are within normal weight and without risk factors for diabetes. Testing should be considered at a younger age or be carried out more frequently if you are overweight and have at least 1 risk factor for diabetes.  Breast cancer screening is essential preventative care for women. You should practice "breast self-awareness." This means understanding the normal appearance and feel of your breasts and may include breast self-examination. Any changes detected, no matter how small, should be reported to a caregiver. Women in their 28s and 30s should have a clinical breast exam (CBE) by a caregiver as part of a regular health exam every 1 to 3 years. After age 32, women should have a CBE every year. Starting at age 36, women should consider having a mammogram (breast X-ray) every year. Women who have a family history of breast cancer should talk to their caregiver about genetic screening. Women at a high risk of breast cancer should talk to their caregiver about having an MRI and a mammogram every year.  Breast cancer gene (BRCA)-related cancer risk assessment is recommended for women who have family members with BRCA-related cancers. BRCA-related cancers include breast, ovarian, tubal, and peritoneal cancers. Having family members with these cancers may be associated with an increased risk for harmful changes (mutations) in the breast cancer genes BRCA1 and BRCA2. Results of the assessment will determine the need for genetic counseling and BRCA1 and BRCA2 testing.  The Pap test is a screening test for cervical cancer. Women should have a Pap test starting at age 14. Between ages 14 and 72, Pap tests should be repeated every 2 years. Beginning at age 30, you should have a Pap test every 3 years as long as the past 3 Pap tests have been normal. If you had a hysterectomy for a problem that was not cancer or a condition that could lead to  cancer, then you no longer need Pap tests. If you are between ages 62 and 35, and you have had normal Pap tests going back 10 years, you no longer need Pap tests. If you have had past treatment for cervical cancer or a condition that could lead to cancer, you need Pap tests and screening for cancer for at least 20 years after your treatment. If Pap tests have been discontinued, risk factors (such as a new sexual partner) need to be reassessed to determine if screening should be resumed. Some women have medical problems that increase the chance of getting cervical cancer. In these cases, your caregiver may recommend more frequent screening and Pap tests.  The human papillomavirus (HPV) test is an additional test that may be used for cervical cancer screening. The HPV test looks for the virus that can cause the cell changes on the cervix. The cells collected during the Pap test can be tested for HPV. The HPV test could be used to screen women aged 46 years and  older, and should be used in women of any age who have unclear Pap test results. After the age of 97, women should have HPV testing at the same frequency as a Pap test.  Colorectal cancer can be detected and often prevented. Most routine colorectal cancer screening begins at the age of 35 and continues through age 25. However, your caregiver may recommend screening at an earlier age if you have risk factors for colon cancer. On a yearly basis, your caregiver may provide home test kits to check for hidden blood in the stool. Use of a small camera at the end of a tube, to directly examine the colon (sigmoidoscopy or colonoscopy), can detect the earliest forms of colorectal cancer. Talk to your caregiver about this at age 55, when routine screening begins. Direct examination of the colon should be repeated every 5 to 10 years through age 78, unless early forms of pre-cancerous polyps or small growths are found.  Hepatitis C blood testing is recommended for  all people born from [birth year] through 1963 and any individual with known risks for hepatitis C.  Practice safe sex. Use condoms and avoid high-risk sexual practices to reduce the spread of sexually transmitted infections (STIs). Sexually active women aged 29 and younger should be checked for Chlamydia, which is a common sexually transmitted infection. Older women with new or multiple partners should also be tested for Chlamydia. Testing for other STIs is recommended if you are sexually active and at increased risk.  Osteoporosis is a disease in which the bones lose minerals and strength with aging. This can result in serious bone fractures. The risk of osteoporosis can be identified using a bone density scan. Women ages 34 and over and women at risk for fractures or osteoporosis should discuss screening with their caregivers. Ask your caregiver whether you should be taking a calcium supplement or vitamin D to reduce the rate of osteoporosis.  Menopause can be associated with physical symptoms and risks. Hormone replacement therapy is available to decrease symptoms and risks. You should talk to your caregiver about whether hormone replacement therapy is right for you.  Use sunscreen. Apply sunscreen liberally and repeatedly throughout the day. You should seek shade when your shadow is shorter than you. Protect yourself by wearing long sleeves, pants, a wide-brimmed hat, and sunglasses year round, whenever you are outdoors.  Notify your caregiver of new moles or changes in moles, especially if there is a change in shape or color. Also notify your caregiver if a mole is larger than the size of a pencil eraser.  Stay current with your immunizations. Document Released: 07/20/2010 Document Revised: 05/01/2012 Document Reviewed: 07/20/2010 [facility] Patient Information 2014 [place].

## 2013-11-12 ENCOUNTER — Other Ambulatory Visit: Payer: BC Managed Care – PPO

## 2013-11-12 DIAGNOSIS — Z01419 Encounter for gynecological examination (general) (routine) without abnormal findings: Secondary | ICD-10-CM

## 2013-11-12 LAB — COMPREHENSIVE METABOLIC PANEL
ALT: 9 U/L (ref 0–35)
AST: 14 U/L (ref 0–37)
Albumin: 4.4 g/dL (ref 3.5–5.2)
Alkaline Phosphatase: 41 U/L (ref 39–117)
BILIRUBIN TOTAL: 0.4 mg/dL (ref 0.2–1.2)
BUN: 14 mg/dL (ref 6–23)
CALCIUM: 8.9 mg/dL (ref 8.4–10.5)
CO2: 26 meq/L (ref 19–32)
CREATININE: 0.73 mg/dL (ref 0.50–1.10)
Chloride: 107 mEq/L (ref 96–112)
GLUCOSE: 90 mg/dL (ref 70–99)
Potassium: 4.7 mEq/L (ref 3.5–5.3)
SODIUM: 137 meq/L (ref 135–145)
Total Protein: 6.9 g/dL (ref 6.0–8.3)

## 2013-11-12 LAB — CBC WITH DIFFERENTIAL/PLATELET
Basophils Absolute: 0 10*3/uL (ref 0.0–0.1)
Basophils Relative: 0 % (ref 0–1)
EOS PCT: 2 % (ref 0–5)
Eosinophils Absolute: 0.1 10*3/uL (ref 0.0–0.7)
HEMATOCRIT: 34.5 % — AB (ref 36.0–46.0)
HEMOGLOBIN: 11.8 g/dL — AB (ref 12.0–15.0)
LYMPHS ABS: 1 10*3/uL (ref 0.7–4.0)
Lymphocytes Relative: 31 % (ref 12–46)
MCH: 29.5 pg (ref 26.0–34.0)
MCHC: 34.2 g/dL (ref 30.0–36.0)
MCV: 86.3 fL (ref 78.0–100.0)
MONOS PCT: 14 % — AB (ref 3–12)
Monocytes Absolute: 0.5 10*3/uL (ref 0.1–1.0)
Neutro Abs: 1.7 10*3/uL (ref 1.7–7.7)
Neutrophils Relative %: 53 % (ref 43–77)
PLATELETS: 155 10*3/uL (ref 150–400)
RBC: 4 MIL/uL (ref 3.87–5.11)
RDW: 12.8 % (ref 11.5–15.5)
WBC: 3.3 10*3/uL — ABNORMAL LOW (ref 4.0–10.5)

## 2013-11-12 LAB — LIPID PANEL
CHOLESTEROL: 187 mg/dL (ref 0–200)
HDL: 63 mg/dL (ref >39)
LDL Cholesterol: 117 mg/dL — ABNORMAL HIGH (ref 0–99)
TRIGLYCERIDES: 36 mg/dL (ref <150)
Total CHOL/HDL Ratio: 3 Ratio
VLDL: 7 mg/dL (ref 0–40)

## 2013-11-12 LAB — HEPATITIS B SURFACE ANTIBODY,QUALITATIVE: Hep B S Ab: POSITIVE — AB

## 2013-11-14 ENCOUNTER — Other Ambulatory Visit: Payer: Self-pay | Admitting: Gynecology

## 2013-11-14 ENCOUNTER — Encounter: Payer: Self-pay | Admitting: Gynecology

## 2013-11-14 DIAGNOSIS — R710 Precipitous drop in hematocrit: Secondary | ICD-10-CM

## 2013-11-14 DIAGNOSIS — D72819 Decreased white blood cell count, unspecified: Secondary | ICD-10-CM

## 2013-11-14 LAB — CYTOLOGY - PAP

## 2013-11-19 ENCOUNTER — Encounter: Payer: Self-pay | Admitting: Gynecology

## 2013-12-18 ENCOUNTER — Encounter (HOSPITAL_COMMUNITY): Payer: Self-pay | Admitting: *Deleted

## 2013-12-27 NOTE — H&P (Signed)
  Allison Lowery September 13, 1970 [id number]   History and Physical  Chief complaint: desires permanent sterilization  History of present illness: 43 y.o. [id number] with Mirena IUD who wants to proceed with tubal sterilization. I have reviewed all forms of contraception with her to include pill, patch, ring, Nexplanon, Depo-Provera, IUD and sterilization. I also reviewed vasectomy, Essure and laparoscopic tubal sterilization. Patient has elected to proceed with laparoscopic tubal sterilization. I reviewed the SGO recommendations to consider prophylactic salpingectomy for risk reductive surgery for "ovarian cancer". She would like to proceed with this and is scheduled for a laparoscopic bilateral salpingectomy.  Past medical history,surgical history, medications, allergies, family history and social history were all reviewed and documented in the EPIC chart.  ROS:  Was performed and pertinent positives and negatives are included in the history of present illness.  Exam:  [name] 11/09/2013 General: well developed, well nourished female, no acute distress HEENT: normal  Lungs: clear to auscultation without wheezing, rales or rhonchi  Cardiac: regular rate without rubs, murmurs or gallops  Abdomen: soft, nontender without masses, guarding, rebound, organomegaly  Pelvic: external bus vagina: normal   Cervix: grossly normal with IUD string visualized Uterus: normal size, midline and mobile, nontender  Adnexa: without masses or tenderness  Rectovaginal exam within normal limits    Assessment/Plan:  43 y.o. [id number] for laparoscopic bilateral salpingectomies. I have reviewed the proposed surgery to include the expected intraoperative/postoperative courses and the recovery period. The absolute irreversibility of sterilization discussed particularly with bilateral salpingectomies and the possibility of failure in the future. The risks of infection, hemorrhage necessitating transfusion and the risks of  transfusion to include transfusion reaction, hepatitis, HIV, mad cow disease and other unknown entities reviewed. The risk of inadvertent injury to internal organs including bowel, bladder, ureters, vessels, nerves necessitating major exploratory reparative surgeries and future reparative surgeries including bowel resection, ostomy formation, bladder repair and ureteral damage repair was all discussed with her. Incisional complications including opening and draining of incisions and closure by secondary intention as well as long-term issues of keloid/cosmetics and hernia formation were also discussed. The patient's questions were answered to her satisfaction and she is ready to proceed with surgery. We will plan on removing her IUD at the time of the surgery.   Anastasio Auerbach MD, 12:14 PM 12/27/2013

## 2013-12-28 ENCOUNTER — Ambulatory Visit (HOSPITAL_COMMUNITY)
Admission: RE | Admit: 2013-12-28 | Discharge: 2013-12-28 | Disposition: A | Discharge status: Home / Self Care | Payer: BC Managed Care – PPO | Source: Ambulatory Visit | Attending: Gynecology | Admitting: Gynecology

## 2013-12-28 ENCOUNTER — Encounter (HOSPITAL_COMMUNITY): Payer: Self-pay

## 2013-12-28 ENCOUNTER — Ambulatory Visit (HOSPITAL_COMMUNITY): Payer: BC Managed Care – PPO | Admitting: Anesthesiology

## 2013-12-28 ENCOUNTER — Encounter (HOSPITAL_COMMUNITY)
Admission: RE | Disposition: A | Discharge status: Home / Self Care | Payer: Self-pay | Source: Ambulatory Visit | Attending: Gynecology

## 2013-12-28 DIAGNOSIS — Z87891 Personal history of nicotine dependence: Secondary | ICD-10-CM | POA: Insufficient documentation

## 2013-12-28 DIAGNOSIS — Z30432 Encounter for removal of intrauterine contraceptive device: Secondary | ICD-10-CM | POA: Insufficient documentation

## 2013-12-28 DIAGNOSIS — Z302 Encounter for sterilization: Secondary | ICD-10-CM | POA: Diagnosis not present

## 2013-12-28 HISTORY — PX: LAPAROSCOPIC BILATERAL SALPINGECTOMY: SHX5889

## 2013-12-28 HISTORY — PX: IUD REMOVAL: SHX5392

## 2013-12-28 LAB — CBC
HEMATOCRIT: 36.1 % (ref 36.0–46.0)
Hemoglobin: 12.1 g/dL (ref 12.0–15.0)
MCH: 30.5 pg (ref 26.0–34.0)
MCHC: 33.5 g/dL (ref 30.0–36.0)
MCV: 90.9 fL (ref 78.0–100.0)
Platelets: 155 10*3/uL (ref 150–400)
RBC: 3.97 MIL/uL (ref 3.87–5.11)
RDW: 12.5 % (ref 11.5–15.5)
WBC: 5 10*3/uL (ref 4.0–10.5)

## 2013-12-28 SURGERY — SALPINGECTOMY, BILATERAL, LAPAROSCOPIC
Anesthesia: General | Site: Uterus

## 2013-12-28 MED ORDER — ACETAMINOPHEN 160 MG/5ML PO SOLN
ORAL | Status: AC
Start: 2013-12-28 — End: 2013-12-28
  Administered 2013-12-28: 975 mg via ORAL
  Filled 2013-12-28: qty 40.6

## 2013-12-28 MED ORDER — ROCURONIUM BROMIDE 100 MG/10ML IV SOLN
INTRAVENOUS | Status: AC
  Filled 2013-12-28: qty 1

## 2013-12-28 MED ORDER — ACETAMINOPHEN 160 MG/5ML PO SOLN
975.0000 mg | Freq: Once | ORAL | Status: AC
  Administered 2013-12-28: 975 mg via ORAL

## 2013-12-28 MED ORDER — OXYCODONE-ACETAMINOPHEN 5-325 MG PO TABS: 1.0000 | ORAL_TABLET | ORAL | Status: DC | PRN

## 2013-12-28 MED ORDER — PHENYLEPHRINE 40 MCG/ML (10ML) SYRINGE FOR IV PUSH (FOR BLOOD PRESSURE SUPPORT)
PREFILLED_SYRINGE | INTRAVENOUS | Status: AC
  Filled 2013-12-28: qty 5

## 2013-12-28 MED ORDER — FENTANYL CITRATE 0.05 MG/ML IJ SOLN
INTRAMUSCULAR | Status: AC
  Filled 2013-12-28: qty 5

## 2013-12-28 MED ORDER — ROCURONIUM BROMIDE 100 MG/10ML IV SOLN
INTRAVENOUS | Status: DC | PRN
  Administered 2013-12-28: 40 mg via INTRAVENOUS

## 2013-12-28 MED ORDER — LACTATED RINGERS IR SOLN
Status: DC | PRN
  Administered 2013-12-28: 3000 mL

## 2013-12-28 MED ORDER — PHENYLEPHRINE HCL 10 MG/ML IJ SOLN
INTRAMUSCULAR | Status: DC | PRN
  Administered 2013-12-28: 40 ug via INTRAVENOUS

## 2013-12-28 MED ORDER — BUPIVACAINE HCL (PF) 0.25 % IJ SOLN
INTRAMUSCULAR | Status: DC | PRN
  Administered 2013-12-28: 9 mL

## 2013-12-28 MED ORDER — PROMETHAZINE HCL 25 MG/ML IJ SOLN: 6.2500 mg | INTRAMUSCULAR | Status: DC | PRN

## 2013-12-28 MED ORDER — LIDOCAINE HCL (CARDIAC) 20 MG/ML IV SOLN
INTRAVENOUS | Status: DC | PRN
  Administered 2013-12-28: 80 mg via INTRAVENOUS

## 2013-12-28 MED ORDER — GLYCOPYRROLATE 0.2 MG/ML IJ SOLN
INTRAMUSCULAR | Status: AC
  Filled 2013-12-28: qty 2

## 2013-12-28 MED ORDER — KETOROLAC TROMETHAMINE 30 MG/ML IJ SOLN
INTRAMUSCULAR | Status: AC
  Filled 2013-12-28: qty 1

## 2013-12-28 MED ORDER — NEOSTIGMINE METHYLSULFATE 10 MG/10ML IV SOLN
INTRAVENOUS | Status: AC
  Filled 2013-12-28: qty 1

## 2013-12-28 MED ORDER — DEXAMETHASONE SODIUM PHOSPHATE 4 MG/ML IJ SOLN
INTRAMUSCULAR | Status: AC
  Filled 2013-12-28: qty 1

## 2013-12-28 MED ORDER — NEOSTIGMINE METHYLSULFATE 10 MG/10ML IV SOLN
INTRAVENOUS | Status: DC | PRN
  Administered 2013-12-28: 3 mg via INTRAVENOUS

## 2013-12-28 MED ORDER — LIDOCAINE HCL (CARDIAC) 20 MG/ML IV SOLN
INTRAVENOUS | Status: AC
  Filled 2013-12-28: qty 5

## 2013-12-28 MED ORDER — PROPOFOL 10 MG/ML IV BOLUS
INTRAVENOUS | Status: DC | PRN
  Administered 2013-12-28: 20 mg via INTRAVENOUS
  Administered 2013-12-28: 160 mg via INTRAVENOUS

## 2013-12-28 MED ORDER — KETOROLAC TROMETHAMINE 30 MG/ML IJ SOLN
INTRAMUSCULAR | Status: DC | PRN
  Administered 2013-12-28: 30 mg via INTRAVENOUS

## 2013-12-28 MED ORDER — BUPIVACAINE HCL (PF) 0.25 % IJ SOLN
INTRAMUSCULAR | Status: AC
  Filled 2013-12-28: qty 30

## 2013-12-28 MED ORDER — FENTANYL CITRATE 0.05 MG/ML IJ SOLN
INTRAMUSCULAR | Status: DC | PRN
  Administered 2013-12-28 (×3): 50 ug via INTRAVENOUS
  Administered 2013-12-28: 100 ug via INTRAVENOUS

## 2013-12-28 MED ORDER — FENTANYL CITRATE 0.05 MG/ML IJ SOLN
INTRAMUSCULAR | Status: AC
  Filled 2013-12-28: qty 2

## 2013-12-28 MED ORDER — ONDANSETRON HCL 4 MG/2ML IJ SOLN
INTRAMUSCULAR | Status: AC
  Filled 2013-12-28: qty 2

## 2013-12-28 MED ORDER — MIDAZOLAM HCL 2 MG/2ML IJ SOLN
INTRAMUSCULAR | Status: DC | PRN
  Administered 2013-12-28: 2 mg via INTRAVENOUS

## 2013-12-28 MED ORDER — KETOROLAC TROMETHAMINE 30 MG/ML IJ SOLN: 15.0000 mg | Freq: Once | INTRAMUSCULAR | Status: DC | PRN

## 2013-12-28 MED ORDER — MEPERIDINE HCL 25 MG/ML IJ SOLN: 6.2500 mg | INTRAMUSCULAR | Status: DC | PRN

## 2013-12-28 MED ORDER — SCOPOLAMINE 1 MG/3DAYS TD PT72
1.0000 | MEDICATED_PATCH | Freq: Once | TRANSDERMAL | Status: DC
  Administered 2013-12-28: 1.5 mg via TRANSDERMAL

## 2013-12-28 MED ORDER — DEXAMETHASONE SODIUM PHOSPHATE 10 MG/ML IJ SOLN
INTRAMUSCULAR | Status: DC | PRN
  Administered 2013-12-28: 4 mg via INTRAVENOUS

## 2013-12-28 MED ORDER — GLYCOPYRROLATE 0.2 MG/ML IJ SOLN
INTRAMUSCULAR | Status: DC | PRN
  Administered 2013-12-28: 0.4 mg via INTRAVENOUS

## 2013-12-28 MED ORDER — PROPOFOL 10 MG/ML IV EMUL
INTRAVENOUS | Status: AC
  Filled 2013-12-28: qty 20

## 2013-12-28 MED ORDER — LACTATED RINGERS IV SOLN
INTRAVENOUS | Status: DC
  Administered 2013-12-28: 10 mL/h via INTRAVENOUS
  Administered 2013-12-28: 14:00:00 via INTRAVENOUS

## 2013-12-28 MED ORDER — FENTANYL CITRATE 0.05 MG/ML IJ SOLN
25.0000 ug | INTRAMUSCULAR | Status: DC | PRN
  Administered 2013-12-28: 25 ug via INTRAVENOUS

## 2013-12-28 MED ORDER — SCOPOLAMINE 1 MG/3DAYS TD PT72
MEDICATED_PATCH | TRANSDERMAL | Status: AC
  Administered 2013-12-28: 1.5 mg via TRANSDERMAL
  Filled 2013-12-28: qty 1

## 2013-12-28 MED ORDER — MIDAZOLAM HCL 2 MG/2ML IJ SOLN
INTRAMUSCULAR | Status: AC
  Filled 2013-12-28: qty 2

## 2013-12-28 MED ORDER — ONDANSETRON HCL 4 MG/2ML IJ SOLN
INTRAMUSCULAR | Status: DC | PRN
  Administered 2013-12-28: 4 mg via INTRAVENOUS

## 2013-12-28 SURGICAL SUPPLY — 27 items
BLADE SURG 11 STRL SS (BLADE) ×4 IMPLANT
BLADE SURG 15 STRL LF C SS BP (BLADE) ×2 IMPLANT
BLADE SURG 15 STRL SS (BLADE) ×2
CATH FOLEY 2W 5CC 18FR LF (CATHETERS) ×4 IMPLANT
CLOTH BEACON ORANGE TIMEOUT ST (SAFETY) ×4 IMPLANT
DRSG OPSITE POSTOP 3X4 (GAUZE/BANDAGES/DRESSINGS) ×8 IMPLANT
GLOVE BIOGEL PI IND STRL 7.0 (GLOVE) ×8 IMPLANT
GLOVE BIOGEL PI IND STRL 8 (GLOVE) ×4 IMPLANT
GLOVE BIOGEL PI INDICATOR 7.0 (GLOVE) ×8
GLOVE BIOGEL PI INDICATOR 8 (GLOVE) ×4
GLOVE SURG SS PI 7.0 STRL IVOR (GLOVE) ×20 IMPLANT
GLOVE SURG SS PI 7.5 STRL IVOR (GLOVE) ×16 IMPLANT
GOWN STRL REUS W/TWL LRG LVL3 (GOWN DISPOSABLE) ×16 IMPLANT
LIQUID BAND (GAUZE/BANDAGES/DRESSINGS) ×8 IMPLANT
NS IRRIG 1000ML POUR BTL (IV SOLUTION) ×4 IMPLANT
PACK LAPAROSCOPY BASIN (CUSTOM PROCEDURE TRAY) ×4 IMPLANT
PAD TRENDELENBURG OR TABLE (MISCELLANEOUS) ×4 IMPLANT
PROTECTOR NERVE ULNAR (MISCELLANEOUS) ×8 IMPLANT
SET IRRIG TUBING LAPAROSCOPIC (IRRIGATION / IRRIGATOR) ×4 IMPLANT
SHEARS HARMONIC ACE PLUS 36CM (ENDOMECHANICALS) ×4 IMPLANT
SUT PLAIN 4 0 FS 2 27 (SUTURE) ×4 IMPLANT
SUT VICRYL 0 UR6 27IN ABS (SUTURE) ×4 IMPLANT
TOWEL OR 17X24 6PK STRL BLUE (TOWEL DISPOSABLE) ×8 IMPLANT
TROCAR XCEL NON-BLD 11X100MML (ENDOMECHANICALS) ×4 IMPLANT
TROCAR XCEL NON-BLD 5MMX100MML (ENDOMECHANICALS) ×8 IMPLANT
WARMER LAPAROSCOPE (MISCELLANEOUS) ×4 IMPLANT
WATER STERILE IRR 1000ML POUR (IV SOLUTION) ×4 IMPLANT

## 2013-12-28 NOTE — Anesthesia Postprocedure Evaluation (Signed)
  Anesthesia Post-op Note  Patient: Allison Lowery  Procedure(s) Performed: Procedure(s): LAPAROSCOPIC BILATERAL SALPINGECTOMY  (Bilateral) INTRAUTERINE DEVICE (IUD) REMOVAL (N/A)  Patient Location: PACU  Anesthesia Type:General  Level of Consciousness: awake, alert  and oriented  Airway and Oxygen Therapy: Patient Spontanous Breathing  Post-op Pain: mild  Post-op Assessment: Post-op Vital signs reviewed, Patient's Cardiovascular Status Stable, Respiratory Function Stable, Patent Airway, No signs of Nausea or vomiting and Pain level controlled  Post-op Vital Signs: Reviewed and stable  Last Vitals:  Filed Vitals:   12/28/13 1430  BP: 109/60  Pulse: 64  Temp:   Resp: 16    Complications: No apparent anesthesia complications

## 2013-12-28 NOTE — H&P (Signed)
  The patient was examined.  I reviewed the proposed surgery and consent form with the patient.  The dictated history and physical is current and accurate and all questions were answered. The patient is ready to proceed with surgery and has a realistic understanding and expectation for the outcome.   Anastasio Auerbach MD, 12:31 PM 12/28/2013

## 2013-12-28 NOTE — Op Note (Addendum)
TAKEILA THAYNE Oct 11, 1970 [id number]   Post Operative Note   Date of surgery:  12/28/2013  Pre Op Dx:  Desires permanent sterilization  Post Op Dx:  Same  Procedure:  Laparoscopic bilateral salpingectomies. Removal of Mirena IUD  Surgeon:  Anastasio Auerbach  Assistant:  Uvaldo Rising  Anesthesia:  General  EBL:  minimal  Complications:  None  Specimen:  Right and left fallopian tubes, Mirena IUD for gross only to pathology  Findings: EUA:  External BUS vagina normal. Cervix normal. Uterus retroverted, normal size, midline,mobile. Adnexa without masses   Operative:  Anterior cul-de-sac normal. Posterior cul-de-sac normal. Uterus retroverted normal size shape and contour. Right and left fallopian tubes normal length caliber fimbriated ends. Right and left ovaries grossly normal. No evidence of pelvic endometriosis or adhesions. Upper abdominal exam shows liver smooth with no abnormalities. Gallbladder grossly normal. Appendix free and mobile. No upper abdominal pathology noted.  Procedure:  The patient was placed in the low dorsal lithotomy position and underwent general anesthesia without difficulty.  The patient received a perineal/vaginal preparation with Betadine, in and out Foley catheterization and subsequent abdominal preparation with DuraPrep skin solution by nursing personnel. The timeout was performed by the surgical team. The cervix was visualized with a speculum, anterior lip grasped with a single-tooth tenaculum and the string of her Mirena IUD was grasped and the Mirena IUD was removed and sent to pathology for gross only.  A Hulka tenaculum was then placed and the single-tooth tenaculum was removed. The patient was draped in the usual fashion and a vertical infraumbilical incision was made, the abdomen was directly entered with a 10 mm direct entry trocar under direct visualization without difficulty. The abdomen was then insufflated and right and left suprapubic 5 mm  ports were placed under direct visualization after transillumination for the vessels. Examination of the pelvic organs and upper abdominal exam was carried out with findings noted above. The left fallopian tube was elevated and traced from its insertion to its fimbriated end. The ureter was identified and found away from the operative site.  Using the harmonic scalpel the entire fallopian tube was excised along the mesosalpinx to the uterine serosa. Care was given to avoid the infundibulopelvic and uterine/ovarian pedicles/vessels. A similar procedure was carried out on the other side. Both fallopian tubes were then retrieved using the operative scope through the infraumbilical port and sent to pathology. The pelvis was copiously irrigated, all surgical sites inspected showing adequate hemostasis under a low pressure situation and both ovaries showed good vascularization. The suprapubic ports were then removed showing adequate hemostasis and the gas was allowed to escape, the infraumbilical port was then backed out under direct visualization showing adequate hemostasis and no evidence of hernia formation. All skin incisions were injected using 0.25% Marcaine and the infraumbilical port was closed using 0 Vicryl suture in an interrupted subcutaneous/fascial stitch. All skin incisions were then closed using Liquiban skin adhesive. The patient received intraoperative Toradol and was awakened without difficulty and taken to recovery room in good condition having tolerated procedure well.     Anastasio Auerbach MD, 2:11 PM 12/28/2013

## 2013-12-28 NOTE — Anesthesia Procedure Notes (Signed)
Procedure Name: Intubation Date/Time: 12/28/2013 1:02 PM Performed by: Raenette Rover Pre-anesthesia Checklist: Patient identified, Emergency Drugs available, Suction available and Patient being monitored Patient Re-evaluated:Patient Re-evaluated prior to inductionOxygen Delivery Method: Circle system utilized Preoxygenation: Pre-oxygenation with 100% oxygen Intubation Type: IV induction Ventilation: Mask ventilation without difficulty Laryngoscope Size: Miller and 2 Grade View: Grade I Tube type: Oral Tube size: 7.0 mm Number of attempts: 1 Airway Equipment and Method: Stylet and Bite block Placement Confirmation: ETT inserted through vocal cords under direct vision,  breath sounds checked- equal and bilateral,  positive ETCO2 and CO2 detector Secured at: 21 cm Tube secured with: Tape (paper tape) Dental Injury: Teeth and Oropharynx as per pre-operative assessment

## 2013-12-28 NOTE — Transfer of Care (Signed)
Immediate Anesthesia Transfer of Care Note  Patient: Allison Lowery  Procedure(s) Performed: Procedure(s): LAPAROSCOPIC BILATERAL SALPINGECTOMY  (Bilateral) INTRAUTERINE DEVICE (IUD) REMOVAL (N/A)  Patient Location: PACU  Anesthesia Type:General  Level of Consciousness: awake, alert , oriented and patient cooperative  Airway & Oxygen Therapy: Patient Spontanous Breathing and Patient connected to nasal cannula oxygen  Post-op Assessment: Report given to PACU RN and Post -op Vital signs reviewed and stable  Post vital signs: Reviewed and stable  Complications: No apparent anesthesia complications

## 2013-12-28 NOTE — Anesthesia Preprocedure Evaluation (Signed)
Anesthesia Evaluation  Patient identified by MRN, date of birth, ID band Patient awake    Reviewed: Allergy & Precautions, H&P , NPO status , Patient's Chart, lab work & pertinent test results, reviewed documented beta blocker date and time   History of Anesthesia Complications Negative for: history of anesthetic complications  Airway Mallampati: I  TM Distance: >3 FB Neck ROM: full    Dental  (+) Teeth Intact   Pulmonary neg pulmonary ROS, former smoker,  breath sounds clear to auscultation  Pulmonary exam normal       Cardiovascular Exercise Tolerance: Good + Valvular Problems/Murmurs (no palpitations in a year) MVP Rhythm:regular Rate:Normal     Neuro/Psych negative neurological ROS  negative psych ROS   GI/Hepatic negative GI ROS, Neg liver ROS,   Endo/Other  negative endocrine ROS  Renal/GU negative Renal ROS  Female GU complaint     Musculoskeletal   Abdominal   Peds  Hematology negative hematology ROS (+)   Anesthesia Other Findings H/o Kathreen Cosier to Sulfa  Reproductive/Obstetrics negative OB ROS                             Anesthesia Physical Anesthesia Plan  ASA: II  Anesthesia Plan: General ETT   Post-op Pain Management:    Induction:   Airway Management Planned:   Additional Equipment:   Intra-op Plan:   Post-operative Plan:   Informed Consent: I have reviewed the patients History and Physical, chart, labs and discussed the procedure including the risks, benefits and alternatives for the proposed anesthesia with the patient or authorized representative who has indicated his/her understanding and acceptance.   Dental Advisory Given  Plan Discussed with: CRNA and Surgeon  Anesthesia Plan Comments:         Anesthesia Quick Evaluation

## 2013-12-28 NOTE — Discharge Instructions (Signed)
Postoperative Instructions Laparoscopy  Dr. Phineas Real and the nursing staff have discussed postoperative instructions with you.  If you have any questions please ask them before you leave the hospital, or call Dr Elisabeth Most office at [phone].    We would like to emphasize the following instructions:   ? Call the office to make your follow-up appointment as recommended by Dr Phineas Real (usually 1-2 weeks).  ? You were given a prescription, or one was ordered for you at the pharmacy you designated.  Get that prescription filled and take the medication according to instructions.  ? You may eat a regular diet, but slowly until you start having bowel movements.  ? Drink plenty of water daily.  ? Nothing in the vagina (intercourse, douching, objects of any kind) for 2 weeks.  When reinitiating intercourse, if it is uncomfortable, stop and make an appointment with Dr Phineas Real to be evaluated.  ? No driving for several days until the anesthesia has worn off and you are not having significant pain.  Car rides (short) are ok, as long as you are not having significant pain, but no traveling out of town until your postoperative appointment.  ? You may shower, but no baths for two weeks.  Walking up and down stairs is ok.  No heavy lifting, prolonged standing, repeated bending or any working out until your first  postoperative appointment.  ? Rest frequently, listen to your body and do not push yourself and overdo it.  ? Call if:  o Your pain medication does not seem strong enough. o Worsening pain or abdominal bloating o Persistent nausea or vomiting o Difficulty with urination or bowel movements. o Temperature of 101 degrees or higher. o Heavy vaginal bleeding.  If your period is due, you may use tampons.   o Incisions become red, tender or begin to drain. You have any questions or concerns Post Anesthesia Home Care Instructions  Activity: Get plenty of rest for the remainder of the day.  A responsible adult should stay with you for 24 hours following the procedure.  For the next 24 hours, DO NOT: -Drive a car -Paediatric nurse -Drink alcoholic beverages -Take any medication unless instructed by your physician -Make any legal decisions or sign important papers.  Meals: Start with liquid foods such as gelatin or soup. Progress to regular foods as tolerated. Avoid greasy, spicy, heavy foods. If nausea and/or vomiting occur, drink only clear liquids until the nausea and/or vomiting subsides. Call your physician if vomiting continues.  Special Instructions/Symptoms: Your throat may feel dry or sore from the anesthesia or the breathing tube placed in your throat during surgery. If this causes discomfort, gargle with warm salt water. The discomfort should disappear within 24 hours. o

## 2013-12-31 ENCOUNTER — Encounter (HOSPITAL_COMMUNITY): Payer: Self-pay | Admitting: Gynecology

## 2014-01-01 ENCOUNTER — Telehealth: Payer: Self-pay | Admitting: Obstetrics and Gynecology

## 2014-01-01 NOTE — Telephone Encounter (Signed)
Patient called after hours to the answering service and I returned her call.  Status post laparoscopic bilateral salpingectomy and removal of IUD on 12/28/13. Calling with pinpoint rash on her abdomen. 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 Denies flu like symptoms, mucous membrane ulceration, or a "burning feeling," the latter of which occurred when she had the [employer] syndrome.  Stopped taking Percocet 2 days ago.  Took 2 Advil today due to headache and took a Dulcolax as well.   Advised to take Claritin or Benadryl.  Avoid Percocet.  Take Tylenol only if need pain medication.  To urgent care of emergency department if rash progresses or develops any symptoms like previous Steven's Johnson reaction.

## 2014-01-02 ENCOUNTER — Emergency Department (HOSPITAL_COMMUNITY)
Admission: EM | Admit: 2014-01-02 | Discharge: 2014-01-02 | Disposition: A | Discharge status: Home / Self Care | Payer: BC Managed Care – PPO | Attending: Emergency Medicine | Admitting: Emergency Medicine

## 2014-01-02 ENCOUNTER — Encounter: Payer: Self-pay | Admitting: Gynecology

## 2014-01-02 ENCOUNTER — Encounter: Payer: Self-pay | Admitting: Internal Medicine

## 2014-01-02 ENCOUNTER — Encounter (HOSPITAL_COMMUNITY): Payer: Self-pay | Admitting: Emergency Medicine

## 2014-01-02 DIAGNOSIS — Z880 Allergy status to penicillin: Secondary | ICD-10-CM | POA: Insufficient documentation

## 2014-01-02 DIAGNOSIS — Z8742 Personal history of other diseases of the female genital tract: Secondary | ICD-10-CM | POA: Diagnosis not present

## 2014-01-02 DIAGNOSIS — Z87828 Personal history of other (healed) physical injury and trauma: Secondary | ICD-10-CM | POA: Insufficient documentation

## 2014-01-02 DIAGNOSIS — Z79899 Other long term (current) drug therapy: Secondary | ICD-10-CM | POA: Insufficient documentation

## 2014-01-02 DIAGNOSIS — R21 Rash and other nonspecific skin eruption: Secondary | ICD-10-CM

## 2014-01-02 DIAGNOSIS — Z8679 Personal history of other diseases of the circulatory system: Secondary | ICD-10-CM | POA: Diagnosis not present

## 2014-01-02 DIAGNOSIS — Z975 Presence of (intrauterine) contraceptive device: Secondary | ICD-10-CM | POA: Insufficient documentation

## 2014-01-02 DIAGNOSIS — Z91040 Latex allergy status: Secondary | ICD-10-CM | POA: Diagnosis not present

## 2014-01-02 DIAGNOSIS — Z87891 Personal history of nicotine dependence: Secondary | ICD-10-CM | POA: Insufficient documentation

## 2014-01-02 DIAGNOSIS — L511 Stevens-Johnson syndrome: Secondary | ICD-10-CM | POA: Diagnosis present

## 2014-01-02 NOTE — ED Notes (Signed)
Pt states that she has hx of Stevens-Johnsons in 2010.  States that she had surgery December 28, 2013.  States that she began getting a rash on her torso and can feel it starting in her arms also.  Went to UC last night and was dx w/ SJ again.  Was given solumedrol and prednisone.  Felt better but states that the burning is getting worse, even with prescriptions.

## 2014-01-02 NOTE — Discharge Instructions (Signed)
Please call your doctor for a followup appointment within 24-48 hours. When you talk to your doctor please let them know that you were seen in the emergency department and have them acquire all of your records so that they can discuss the findings with you and formulate a treatment plan to fully care for your new and ongoing problems. Please call and set-up an appointment with your primary care provider Please rest and stay hydrated Please avoid any physical or strenuous activity  Please continue to take medications as prescribed Please continue to monitor symptoms closely and if symptoms are to worsen or change (fever greater than 101, chills, sweating, nausea, vomiting, chest pain, shortness of breathe, difficulty breathing, weakness, numbness, tingling, worsening or changes to pain pattern, sloughing of skin, vesicles, blisters, drainage of the skin, peeling, ulcers in the mouth, bleeding, blood in the sputum/saliva/vomit) please report back to the Emergency Department immediately.    Stevens-Johnson Syndrome Stevens-Johnson syndrome (SJS) is a rare, serious skin and mucous membrane disorder. SJS can involve the oral, nasal, eye, vaginal, urethral, gastrointestinal, and lower respiratory tract mucous membranes. This disorder eventually causes the top layer of skin to die and shed. This is an emergency medical condition usually needing hospitalization. The more skin that is lost, the more serious and life-threatening the disorder becomes. CAUSES  SJS is usually caused by an allergic reaction to medicine. Medicines such as antibiotics (sulfa, penicillin) and antiseizure medicines (phenytoin) have been prescribed to more than  of all patients with SJS. Other medicines known to cause SJS include medicine used for gout (allopurinol), cocaine, other antiseizure medicines (carbamazepine, barbiturates), and nonsteroidal anti-inflammatory drugs (NSAIDS, such as ibuprofen and naproxen). Other causes of SJS are  viral infections (mycoplasma), bacterial infections, and rarely cancer. Sometimes the cause is unknown. SYMPTOMS SJS often begins with several days of flu-like symptoms, such as:  Fever.  Sore throat.  Cough.  Burning eyes. After the first several days, your mucous membranes become inflamed. A painful red or purplish rash develops on the face and trunk, spreads, and creates blisters over your skin. Other common signs of SJS include:  Swelling in the face.  Hives.  Skin pain.  Joint aches.  Sensitivity to light (photophobia).  Difficulty eating or swallowing due to pain in the mouth and throat.  Difficulty urinating.  Low blood pressure and fast heart rate.  Nosebleeds and sore mucous membranes.  Blisters on your skin and mucous membranes, especially in your mouth, nose, and eyes.  Shedding of your skin.  Tongue swelling. DIAGNOSIS  Your caregiver can often recognize SJS from your medical history, a physical exam, and your symptoms.  To confirm the diagnosis, your caregiver may take a tissue sample (biopsy) from your skin to be examined under a microscope.  Blood tests and other general lab work may also be done. TREATMENT Treatment focuses on getting rid of the cause, if possible, and minimizing or preventing complications. If the cause of SJS can be removed and the skin reaction can be stopped, your skin may begin to grow again within several days. In severe cases, full recovery may take weeks to months. Prompt treatment usually shortens the length of the illness.  SJS requires hospitalization, often in an intensive care unit or burn unit.  Any medicines that could be causing SJS are stopped.  Pain medicines will be given as needed for discomfort.  Antibiotics are given for infection, if needed.  Immunoglobulin may be given by vein to try to stop the disease  process.  Skin loss can result in large losses of fluid from your body. These fluids must be replaced.  You may receive fluids and nutrients through a tube placed through your nose and into your stomach (nasogastric tube).  Blisters that have not broken may be left to heal on their own. Your caregiver may gently remove dead skin and put a bandage (dressing) on those areas.  Skin grafting may be used on large areas, but this is rarely needed. PREVENTION Preventing a recurrence of SJS is critical. A recurrence is usually more severe than the first episode and can be life-threatening.  If your SJS was caused by a medicine, be sure to avoid that medicine and others in the same drug class. This information will be listed in your medical chart.  If herpes virus caused your reaction, you may need to take daily antiviral medicines to prevent a recurrence. HOME CARE INSTRUCTIONS  Know what caused your reaction. If a medicine caused your reaction, learn the name of that medicine and any other related medicines that could cause a recurrence.  Let all your caregivers know about your history of SJS. If the reaction was caused by a medicine, provide your caregivers with the name of that medicine.  Wear a medical bracelet or necklace that says you had SJS and what caused it. SEEK IMMEDIATE MEDICAL CARE IF:  You cough up thick saliva mixed with mucus (sputum).  You have a headache.  You feel very tired.  You have joint aches and pain. Document Released: 09/17/2010 Document Revised: 03/29/2011 Document Reviewed: 09/17/2010 [facility] Patient Information 2015 [place], [place]. This information is not intended to replace advice given to you by your health care provider. Make sure you discuss any questions you have with your health care provider.

## 2014-01-02 NOTE — ED Provider Notes (Signed)
Medical screening examination/treatment/procedure(s) were conducted as a shared visit with non-physician practitioner(s) and myself.  I personally evaluated the patient during the encounter.   EKG Interpretation None     Patient here with worsening rash. Diagnosis units Johnson syndrome. Is currently taking corticosteroids. On exam she has no evidence of intraoral involvement. She was instructed to continue to take her medications and follow-up as she has already been directed  Leota Jacobsen, MD 01/02/14 1327

## 2014-01-02 NOTE — Telephone Encounter (Signed)
Check to see if patient needs anything for pain. She can try the prescription strength ibuprofen 800 mg 3 times daily.  #30 refill 1

## 2014-01-02 NOTE — ED Provider Notes (Signed)
CSN: [id number]     Arrival date & time 01/02/14  1207 History  This chart was scribed for non-physician practitioner, Jamse Mead, PA-C, working with Leota Jacobsen, MD, by Chester Holstein, ED Scribe. This patient was seen in room WTR6/WTR6 and the patient's care was started at 12:36 PM.    Chief Complaint  Patient presents with  . Stevens-Johnson Syndrome     The history is provided by the patient. No language interpreter was used.    HPI Comments: Allison Lowery is a 43 y.o. female with PMHx of Stevens-Johnson Syndrome, MVP who presents to the Emergency Department complaining of a rash with onset yesterday.  Pt notes she first noticed a light rash on her abdomen 4 days ago. She states the rash spread to her arms with onset yesterday and associated burning with onset 50 minutes later. Pt was seen in Urgent Care last night.  She was given Solumedrol IM and prednisone.  Pt took the predisone once last night and again this morning with mild relief. Pt states the rash has now spread to her upper torso and the burning increased around 45 minutes PTA. Pt notes only pain with touch and denies any skin sloughing off or blisters. Pt notes rash is similar to previous rash in 2010 due to an adverse reaction to Bactrim. Pt had a laparoscopic bialteral salpingectomy and IUD removal on 12/28/13.   Pt denies fever, throat pain, difficulty swallowing, eye pain, eye burning, chest pain, SOB, difficulty breathing, hemoptysis, hematemesis.  Pt's PCP is Dr. Burnice Logan   Past Medical History  Diagnosis Date  . ADVERSE DRUG REACTION 12/27/2008  . CHEST PAIN, PLEURITIC 03/26/2009  . Palpitations 10/30/2007  . PERIMENOPAUSAL SYNDROME 10/30/2007  . MVP (mitral valve prolapse)   . IUD 09/2008    MIRENA INSERTED 09/2003 AND REPLACED IN 09/2008   Past Surgical History  Procedure Laterality Date  . Dilation and curettage of uterus    . Bladder suspension  07/04/2008    DR. SCOTT MACDIARMID  . Intrauterine  device insertion  09/2008    mirena  . Laparoscopic bilateral salpingectomy Bilateral 12/28/2013    Procedure: LAPAROSCOPIC BILATERAL SALPINGECTOMY ;  Surgeon: Anastasio Auerbach, MD;  Location: [facility];  Service: Gynecology;  Laterality: Bilateral;  . Iud removal N/A 12/28/2013    Procedure: INTRAUTERINE DEVICE (IUD) REMOVAL;  Surgeon: Anastasio Auerbach, MD;  Location: [facility];  Service: Gynecology;  Laterality: N/A;   Family History  Problem Relation Age of Onset  . Hypertension Mother   . Thyroid disease Mother    History  Substance Use Topics  . Smoking status: Former Smoker    Quit date: 01/18/1998  . Smokeless tobacco: Never Used  . Alcohol Use: Yes     Comment: Rare   OB History    Gravida Para Term Preterm AB TAB SAB Ectopic Multiple Living   3 2   1     2      Review of Systems  Constitutional: Negative for fever.  HENT: Negative for sore throat and trouble swallowing.   Eyes: Negative for pain.       Negative for burning  Respiratory: Negative for shortness of breath.   Cardiovascular: Negative for chest pain.  Skin: Positive for rash.      Allergies  Percocet; Amoxicillin; Erythromycin; Penicillin g benzathine; Sulfamethoxazole-trimethoprim; Tetracycline; and Latex  Home Medications   Prior to Admission medications   Medication Sig Start Date End Date Taking? Authorizing Provider  levonorgestrel (MIRENA) 20  MCG/24HR IUD 1 each by Intrauterine route once. INSERTED 09/2008.    Historical Provider, MD  Multiple Vitamins-Minerals (MULTIVITAMIN WITH MINERALS) tablet Take 1 tablet by mouth daily.    Historical Provider, MD  oxyCODONE-acetaminophen (ROXICET) 5-325 MG per tablet Take 1 tablet by mouth every 4 (four) hours as needed for severe pain. 12/28/13   Anastasio Auerbach, MD   BP 152/80 mmHg  Pulse 117  Temp(Src) 97.1 F (36.2 C) (Oral)  Resp 15  SpO2 100%  LMP  Physical Exam  Constitutional: She is oriented to person, place, and time. She appears  well-developed and well-nourished. No distress.  HENT:  Head: Normocephalic and atraumatic.  Mouth/Throat: Oropharynx is clear and moist. No oropharyngeal exudate.  Negative ulcers a lesions identified to the buccal mucosa bilaterally and posterior oropharynx  Eyes: Conjunctivae and EOM are normal. Pupils are equal, round, and reactive to light. Right eye exhibits no discharge. Left eye exhibits no discharge.  Neck: Normal range of motion. Neck supple. No tracheal deviation present.  Negative neck stiffness Negative nuchal rigidity  Negative cervical lymphadenopathy  Negative meningeal signs   Cardiovascular: Normal rate, regular rhythm and normal heart sounds.  Exam reveals no friction rub.   No murmur heard. Pulses:      Radial pulses are 2+ on the right side, and 2+ on the left side.  Pulmonary/Chest: Effort normal and breath sounds normal. No respiratory distress. She has no wheezes. She has no rales.  Patient is able to speak in full sentences without difficulty  Negative use of accessory muscles Negative stridor  Abdominal: Soft. Bowel sounds are normal. She exhibits no distension. There is no tenderness. There is no rebound and no guarding.  Laparoscopic incision sites identified to the abdomen 3-healing well with negative erythema, inflammation, swelling, warmth upon palpation. Negative active drainage or bleeding noted. Covered with Dermabond.  Musculoskeletal: Normal range of motion.  Lymphadenopathy:    She has no cervical adenopathy.  Neurological: She is alert and oriented to person, place, and time. No cranial nerve deficit. She exhibits normal muscle tone. Coordination normal.  Skin: Skin is warm. She is not diaphoretic. There is erythema.  Erythema of the skin identified to the upper abdomen, chest, chin and bilateral cheeks. Warm upon palpation. Negative blisters or vesicles identified. Negative pain upon palpation. Warm to the touch.  Psychiatric: She has a normal mood  and affect. Her behavior is normal. Thought content normal.  Nursing note and vitals reviewed.   ED Course  Procedures (including critical care time) DIAGNOSTIC STUDIES: Oxygen Saturation is 100% on room air, normal by my interpretation.    COORDINATION OF CARE: 12:42 PM Discussed treatment plan with patient at beside, the patient agrees with the plan and has no further questions at this time.   Labs Review Labs Reviewed - No data to display  Imaging Review No results found.   EKG Interpretation None       1:23 PM Patient seen and assessed by attending physician, Dr. Madison Hickman - physician reported that patient can be discharged home.   MDM   Final diagnoses:  Rash    Medications - No data to display  Filed Vitals:   01/02/14 1212  BP: 152/80  Pulse: 117  Temp: 97.1 F (36.2 C)  TempSrc: Oral  Resp: 15  SpO2: 100%   I personally performed the services described in this documentation, which was scribed in my presence. The recorded information has been reviewed and is accurate.  Patient presenting  to the ED with rash to the upper abdomen, chest and cheeks that started this morning. Patient reported that the rash started on Sunday night localized to the abdomen, reported that the rash increased and increased burning at approximate 4:30 yesterday where she was then seen and assessed in urgent care center-reported Solu-Medrol IM and prednisone administered. Patient has been taking prednisone as prescribed. Stated that she is concerned secondary to the rash increasing to her chest. Patient reported that she had a severe case a couple of years ago where she was hospitalized. Patient appears anxious and concerned. On physical exam unremarkable-negative signs of respiratory distress. Negative ulcerations. Erythema identified to the upper abdomen, chest, cheeks bilaterally with warmth upon palpation-negative vesicles, blisters, drainage or bleeding noted. Negative sloughing of skin.  Negative ulcerations identified to the mouth. Abdomen exam benign. Patient seen and assessed by attending physician, Dr. Zenia Resides who cleared patient for discharge. Patient stable, afebrile. Patient not septic appearing. Patient taking the proper medications. Reassured patient that she's doing all the right steps. Referred patient to PCP. Discussed with patient rest and stay hydrated. Discussed with patient to continue taking medications as prescribed. Discussed with patient to closely monitor symptoms and if symptoms are to worsen or change to report back to the ED - strict return instructions given.  Patient agreed to plan of care, understood, all questions answered.   Jamse Mead, PA-C 01/02/14 1332  Leota Jacobsen, MD 01/03/14 [id number]

## 2014-01-08 ENCOUNTER — Ambulatory Visit (INDEPENDENT_AMBULATORY_CARE_PROVIDER_SITE_OTHER): Payer: BC Managed Care – PPO | Admitting: Gynecology

## 2014-01-08 ENCOUNTER — Encounter: Payer: Self-pay | Admitting: Gynecology

## 2014-01-08 DIAGNOSIS — Z9889 Other specified postprocedural states: Secondary | ICD-10-CM

## 2014-01-08 NOTE — Patient Instructions (Signed)
Follow up October 2016 when you're due for your annual exam.

## 2014-01-08 NOTE — Progress Notes (Signed)
BRINN WESTBY [birth year] [id number]        43 y.o.  [id number] Presents for her postoperative visit status post laparoscopic bilateral salpingectomy for sterilization. Doing well without complaints.  Past medical history,surgical history, problem list, medications, allergies, family history and social history were all reviewed and documented in the EPIC chart.  Directed ROS with pertinent positives and negatives documented in the history of present illness/assessment and plan.  Exam: [name] General appearance:  Normal Abdomen soft nontender without masses guarding rebound. Abdominal incisions healed nicely. Pelvic external BUS vagina normal. Cervix normal. Uterus normal size midline mobile nontender. Adnexa without masses or tenderness.  Assessment/Plan:  43 y.o. [id number] with normal postoperative visit status post laparoscopic our lateral salpingectomies. I reviewed the benign pathology report with her as well as pictures from the surgery. Follow up in October 2016 when she is due for her annual exam, sooner as needed.     Anastasio Auerbach MD, 12:35 PM 01/08/2014

## 2014-12-05 ENCOUNTER — Ambulatory Visit (INDEPENDENT_AMBULATORY_CARE_PROVIDER_SITE_OTHER): Payer: Commercial Managed Care - PPO | Admitting: Internal Medicine

## 2014-12-05 ENCOUNTER — Encounter: Payer: Self-pay | Admitting: Internal Medicine

## 2014-12-05 ENCOUNTER — Ambulatory Visit (INDEPENDENT_AMBULATORY_CARE_PROVIDER_SITE_OTHER): Payer: Commercial Managed Care - PPO | Admitting: Family Medicine

## 2014-12-05 VITALS — BP 116/74 | HR 77 | Temp 98.0°F | Ht 63.0 in | Wt 129.0 lb

## 2014-12-05 DIAGNOSIS — M25562 Pain in left knee: Secondary | ICD-10-CM | POA: Insufficient documentation

## 2014-12-05 DIAGNOSIS — S83249A Other tear of medial meniscus, current injury, unspecified knee, initial encounter: Secondary | ICD-10-CM | POA: Insufficient documentation

## 2014-12-05 DIAGNOSIS — S83242A Other tear of medial meniscus, current injury, left knee, initial encounter: Secondary | ICD-10-CM | POA: Diagnosis not present

## 2014-12-05 NOTE — Patient Instructions (Signed)
Good to see you.  Ice 20 minutes 2 times daily. Usually after activity and before bed. Exercises 3 times a week.  pennsaid pinkie amount topically 2 times daily as needed.  Turmeric 575m twice daily Vitamin D 2000 IU daily Ok to do elliptical and biking no jumping, squatting or running Happy holidays! See me again in 3 weeks.

## 2014-12-05 NOTE — Patient Instructions (Signed)
You have a left knee effusion (swelling)  I will ask Dr Tamala Julian to see you today  Please continue all other medications as before, and refills have been done if requested.  Please have the pharmacy call with any other refills you may need.  Please keep your appointments with your specialists as you may have planned

## 2014-12-05 NOTE — Assessment & Plan Note (Signed)
Patient sent of a small effusion as well as a positive McMurray's. On ultrasound today patient does have what appears to be an acute tear of the meniscus that is nondisplaced. Increasing hypoechoic changes in Doppler flow. Patient's elected try conservative therapy. Given home exercises, icing protocol, topical anti-inflammatories but warned the potential side effects. Patient knows to stop this if any rash occurs. We discussed over-the-counter natural supplementations. We discussed which activities to avoid at this time. Patient will come back and see me again in 3 weeks. Patient is not making improvement we will consider injection. If worsening symptoms or locking we will need to consider advanced imaging.

## 2014-12-05 NOTE — Progress Notes (Signed)
   Subjective:    Patient ID: Allison Lowery, female    DOB: 07-27-1970, 44 y.o.   MRN: [id number]  HPI    Review of Systems     Objective:   Physical Exam        Assessment & Plan:

## 2014-12-05 NOTE — Progress Notes (Signed)
   Subjective:    Patient ID: Allison Lowery, female    DOB: December 06, 1970, 44 y.o.   MRN: [id number]  HPI  Here with c/o acute onset left knee pain, sharp, mod to severe with acute swelling, after felt a pop like sensation while knee was bent and leaning backward in a position for pilades class.  No radiation, nothing makes better, worse to stand and walk, limping today, No giveaways Past Medical History  Diagnosis Date  . ADVERSE DRUG REACTION 12/27/2008  . CHEST PAIN, PLEURITIC 03/26/2009  . Palpitations 10/30/2007  . PERIMENOPAUSAL SYNDROME 10/30/2007  . MVP (mitral valve prolapse)   . IUD 09/2008    MIRENA INSERTED 09/2003 AND REPLACED IN 09/2008   Past Surgical History  Procedure Laterality Date  . Dilation and curettage of uterus    . Bladder suspension  07/04/2008    DR. SCOTT MACDIARMID  . Intrauterine device insertion  09/2008    mirena  . Laparoscopic bilateral salpingectomy Bilateral 12/28/2013    Procedure: LAPAROSCOPIC BILATERAL SALPINGECTOMY ;  Surgeon: Anastasio Auerbach, MD;  Location: [facility];  Service: Gynecology;  Laterality: Bilateral;  . Iud removal N/A 12/28/2013    Procedure: INTRAUTERINE DEVICE (IUD) REMOVAL;  Surgeon: Anastasio Auerbach, MD;  Location: [facility];  Service: Gynecology;  Laterality: N/A;    reports that she quit smoking about 16 years ago. She has never used smokeless tobacco. She reports that she drinks alcohol. She reports that she does not use illicit drugs. family history includes Hypertension in her mother; Thyroid disease in her mother. Allergies  Allergen Reactions  . Percocet [Oxycodone-Acetaminophen] Rash  . Amoxicillin Hives  . Erythromycin Hives  . Penicillin G Benzathine Hives  . Sulfamethoxazole-Trimethoprim     Allison Lowery syndrome  . Tetracycline Hives  . Latex Rash   Current Outpatient Prescriptions on File Prior to Visit  Medication Sig Dispense Refill  . Multiple Vitamins-Minerals (MULTIVITAMIN WITH MINERALS) tablet Take 1  tablet by mouth daily.     No current facility-administered medications on file prior to visit.   Review of Systems All otherwise neg per pt     Objective:   Physical Exam BP 116/74 mmHg  Pulse 77  Temp(Src) 98 F (36.7 C) (Oral)  Ht 5' 3"  (1.6 m)  Wt 129 lb (58.514 kg)  BMI 22.86 kg/m2  SpO2 98%  LMP 11/04/2014 (Approximate) VS noted,  Constitutional: Pt appears in no significant distress HENT: Head: NCAT.  Lowery Ear: External ear normal.  Left Ear: External ear normal.  Eyes: . Pupils are equal, round, and reactive to light. Conjunctivae and EOM are normal Neck: Normal range of motion. Neck supple.  Cardiovascular: Normal rate and regular rhythm.   Pulmonary/Chest: Effort normal and breath sounds without rales or wheezing.  Neurological: Pt is alert. Not confused , motor grossly intact Skin: Skin is warm. No rash, no LE edema Psychiatric: Pt behavior is normal. No agitation.  Left knee tender medial joint line, 1+ effusion, FROM       Assessment & Plan:

## 2014-12-05 NOTE — Progress Notes (Signed)
Pre visit review using our clinic review tool, if applicable. No additional management support is needed unless otherwise documented below in the visit note. 

## 2014-12-05 NOTE — Progress Notes (Signed)
[facility] [place], [place] [address] Phone: [phone] Subjective:    I'm seeing this patient by the request  of:  Nyoka Cowden, MD Jenny Reichmann, MD  CC: Left knee pain  [id number] Allison Lowery is a 44 y.o. female coming in with complaint of left knee pain. Patient states yesterday she was in Pilates class and was kneeling on her knee. Had an audible pop and significant pain and swelling. This morning woke up and had even difficulty with ambulation. States that it seems to be worsening somewhat over the course of time. Mild instability of the knee. No radiation of pain. No weakness. Has not tried taking any medications or any other home modalities at this time. I just limited some overactivity. No history of injury to this knee previously.     Past Medical History  Diagnosis Date  . ADVERSE DRUG REACTION 12/27/2008  . CHEST PAIN, PLEURITIC 03/26/2009  . Palpitations 10/30/2007  . PERIMENOPAUSAL SYNDROME 10/30/2007  . MVP (mitral valve prolapse)   . IUD 09/2008    MIRENA INSERTED 09/2003 AND REPLACED IN 09/2008   Past Surgical History  Procedure Laterality Date  . Dilation and curettage of uterus    . Bladder suspension  07/04/2008    DR. SCOTT MACDIARMID  . Intrauterine device insertion  09/2008    mirena  . Laparoscopic bilateral salpingectomy Bilateral 12/28/2013    Procedure: LAPAROSCOPIC BILATERAL SALPINGECTOMY ;  Surgeon: Anastasio Auerbach, MD;  Location: [facility];  Service: Gynecology;  Laterality: Bilateral;  . Iud removal N/A 12/28/2013    Procedure: INTRAUTERINE DEVICE (IUD) REMOVAL;  Surgeon: Anastasio Auerbach, MD;  Location: [facility];  Service: Gynecology;  Laterality: N/A;   Social History  Substance Use Topics  . Smoking status: Former Smoker    Quit date: 01/18/1998  . Smokeless tobacco: Never Used  . Alcohol Use: Yes     Comment: Rare   Allergies  Allergen Reactions  . Percocet [Oxycodone-Acetaminophen]  Rash  . Amoxicillin Hives  . Erythromycin Hives  . Penicillin G Benzathine Hives  . Sulfamethoxazole-Trimethoprim     [name] syndrome  . Tetracycline Hives  . Latex Rash   Family History  Problem Relation Age of Onset  . Hypertension Mother   . Thyroid disease Mother      Past medical history, social, surgical and family history all reviewed in electronic medical record.   Review of Systems: No headache, visual changes, nausea, vomiting, diarrhea, constipation, dizziness, abdominal pain, skin rash, fevers, chills, night sweats, weight loss, swollen lymph nodes, body aches, joint swelling, muscle aches, chest pain, shortness of breath, mood changes.   Objective Last menstrual period 11/04/2014.  General: No apparent distress alert and oriented x3 mood and affect normal, dressed appropriately.  HEENT: Pupils equal, extraocular movements intact  Respiratory: Patient's speak in full sentences and does not appear short of breath  Cardiovascular: No lower extremity edema, non tender, no erythema  Skin: Warm dry intact with no signs of infection or rash on extremities or on axial skeleton.  Abdomen: Soft nontender  Neuro: Cranial nerves II through XII are intact, neurovascularly intact in all extremities with 2+ DTRs and 2+ pulses.  Lymph: No lymphadenopathy of posterior or anterior cervical chain or axillae bilaterally.  Gait normal with good balance and coordination.  MSK:  Non tender with full range of motion and good stability and symmetric strength and tone of shoulders, elbows, wrist, hip, and ankles bilaterally.  Knee:  Left Trace effusion noted Severely tender to palpation over the medial joint line ROM full in flexion and extension and lower leg rotation. Ligaments with solid consistent endpoints including ACL, PCL, LCL, MCL. Positive Mcmurray's, Apley's, and Thessalonian tests. Non painful patellar compression. Patellar glide without crepitus. Patellar and quadriceps  tendons unremarkable. Hamstring and quadriceps strength is normal.  Contralateral knee unremarkable  MSK US performed of: Left This study was ordered, performed, and interpreted by Charlann Boxer D.O.  Knee: All structures visualized. Trace effusion noted Patient does have what appears to be a large tear of the medial meniscus. Proximally 60% of the meniscus is noted on the periphery but no significant displacement. Significant hypoechoic changes and mild increase in Doppler flow. Patellar Tendon unremarkable on long and transverse views without effusion. No abnormality of prepatellar bursa. LCL and MCL unremarkable on long and transverse views. No abnormality of origin of medial or lateral head of the gastrocnemius.  IMPRESSION:  Acute meniscal tear medially with minimal this placement    Impression and Recommendations:     This case required medical decision making of moderate complexity.

## 2014-12-06 ENCOUNTER — Encounter: Payer: Self-pay | Admitting: Family Medicine

## 2014-12-07 NOTE — Assessment & Plan Note (Signed)
Acute onset with exercise, suspect meniscal tear, to see sport med today, pain control

## 2014-12-24 ENCOUNTER — Ambulatory Visit: Payer: Commercial Managed Care - PPO | Admitting: Family Medicine

## 2014-12-27 ENCOUNTER — Encounter: Payer: Self-pay | Admitting: Gynecology

## 2015-01-01 ENCOUNTER — Encounter: Payer: Self-pay | Admitting: Family Medicine

## 2015-01-01 ENCOUNTER — Ambulatory Visit (INDEPENDENT_AMBULATORY_CARE_PROVIDER_SITE_OTHER): Payer: Commercial Managed Care - PPO | Admitting: Family Medicine

## 2015-01-01 VITALS — BP 118/80 | HR 67 | Ht 63.0 in | Wt 130.0 lb

## 2015-01-01 DIAGNOSIS — S83242A Other tear of medial meniscus, current injury, left knee, initial encounter: Secondary | ICD-10-CM | POA: Diagnosis not present

## 2015-01-01 NOTE — Assessment & Plan Note (Signed)
Patient has made improvement but is still tender. I do feel that formal physical therapy. Patient is going to be referred today. Encourage her to start increasing her activity slowly. We discussed the possibility of a TENS unit. Patient will continue with the vitamin supplementation. We did discuss the possibility of an injection the patient has a severe fear of needles. Patient even stated that she may want to have surgery before injection. We will discuss again in follow-up in 4-6 weeks for further evaluation.  Spent  25 minutes with patient face-to-face and had greater than 50% of counseling including as described above in assessment and plan.

## 2015-01-01 NOTE — Progress Notes (Signed)
[facility] [address] [place], [place] [address] Phone: [phone] Subjective:    I'm seeing this patient by the request  of:  Nyoka Cowden, MD Jenny Reichmann, MD  CC: Left knee pain follow up  [id number] Allison Lowery is a 44 y.o. female coming in with complaint of left knee pain. Patient states yesterday she was in Pilates class and was kneeling on her knee. Patient initially injured it. Patient though had been doing somewhat better. Patient actually had a meniscal injury. Patient states that pain is still there but 50% better. No locking or giving out on her. Denies any numbness or tingling. No radiation. Has not started to increase her activity. No pain with regular daily activities except for squatting and twisting motions. No swelling. Feels like she is improving but very slow.     Past Medical History  Diagnosis Date  . ADVERSE DRUG REACTION 12/27/2008  . CHEST PAIN, PLEURITIC 03/26/2009  . Palpitations 10/30/2007  . PERIMENOPAUSAL SYNDROME 10/30/2007  . MVP (mitral valve prolapse)   . IUD 09/2008    MIRENA INSERTED 09/2003 AND REPLACED IN 09/2008   Past Surgical History  Procedure Laterality Date  . Dilation and curettage of uterus    . Bladder suspension  07/04/2008    DR. SCOTT MACDIARMID  . Intrauterine device insertion  09/2008    mirena  . Laparoscopic bilateral salpingectomy Bilateral 12/28/2013    Procedure: LAPAROSCOPIC BILATERAL SALPINGECTOMY ;  Surgeon: Anastasio Auerbach, MD;  Location: [facility];  Service: Gynecology;  Laterality: Bilateral;  . Iud removal N/A 12/28/2013    Procedure: INTRAUTERINE DEVICE (IUD) REMOVAL;  Surgeon: Anastasio Auerbach, MD;  Location: [facility];  Service: Gynecology;  Laterality: N/A;   Social History  Substance Use Topics  . Smoking status: Former Smoker    Quit date: 01/18/1998  . Smokeless tobacco: Never Used  . Alcohol Use: Yes     Comment: Rare   Allergies  Allergen Reactions  .  Percocet [Oxycodone-Acetaminophen] Rash  . Amoxicillin Hives  . Erythromycin Hives  . Penicillin G Benzathine Hives  . Sulfamethoxazole-Trimethoprim     [name] syndrome  . Tetracycline Hives  . Latex Rash   Family History  Problem Relation Age of Onset  . Hypertension Mother   . Thyroid disease Mother      Past medical history, social, surgical and family history all reviewed in electronic medical record.   Review of Systems: No headache, visual changes, nausea, vomiting, diarrhea, constipation, dizziness, abdominal pain, skin rash, fevers, chills, night sweats, weight loss, swollen lymph nodes, body aches, joint swelling, muscle aches, chest pain, shortness of breath, mood changes.   Objective Blood pressure 118/80, pulse 67, height 5' 3"  (1.6 m), weight 130 lb (58.968 kg), last menstrual period 11/04/2014, SpO2 97 %.  General: No apparent distress alert and oriented x3 mood and affect normal, dressed appropriately.  HEENT: Pupils equal, extraocular movements intact  Respiratory: Patient's speak in full sentences and does not appear short of breath  Cardiovascular: No lower extremity edema, non tender, no erythema  Skin: Warm dry intact with no signs of infection or rash on extremities or on axial skeleton.  Abdomen: Soft nontender  Neuro: Cranial nerves II through XII are intact, neurovascularly intact in all extremities with 2+ DTRs and 2+ pulses.  Lymph: No lymphadenopathy of posterior or anterior cervical chain or axillae bilaterally.  Gait normal with good balance and coordination.  MSK:  Non tender with full range  of motion and good stability and symmetric strength and tone of shoulders, elbows, wrist, hip, and ankles bilaterally.  Knee: Left No effusion noted Moderately tender. Improved from previous exam ROM full in flexion and extension and lower leg rotation. Ligaments with solid consistent endpoints including ACL, PCL, LCL, MCL. Positive Mcmurray's, Apley's,  and Thessalonian tests. Still positive Non painful patellar compression. Patellar glide without crepitus. Patellar and quadriceps tendons unremarkable. Hamstring and quadriceps strength is normal.  Contralateral knee unremarkable     Impression and Recommendations:     This case required medical decision making of moderate complexity.

## 2015-01-01 NOTE — Patient Instructions (Signed)
Good to see you PT will be calling you Ice is your friend Continue the exercises 3-5 times a week Continue the vitamins.  Happy holidays!  See me again in 4 weeks.

## 2015-01-01 NOTE — Progress Notes (Signed)
Pre visit review using our clinic review tool, if applicable. No additional management support is needed unless otherwise documented below in the visit note. 

## 2015-01-21 ENCOUNTER — Ambulatory Visit (INDEPENDENT_AMBULATORY_CARE_PROVIDER_SITE_OTHER): Payer: Commercial Managed Care - PPO | Admitting: Gynecology

## 2015-01-21 ENCOUNTER — Telehealth: Payer: Self-pay | Admitting: *Deleted

## 2015-01-21 ENCOUNTER — Encounter: Payer: Self-pay | Admitting: Gynecology

## 2015-01-21 ENCOUNTER — Other Ambulatory Visit: Payer: Self-pay

## 2015-01-21 VITALS — BP 112/64

## 2015-01-21 DIAGNOSIS — N644 Mastodynia: Secondary | ICD-10-CM

## 2015-01-21 NOTE — Patient Instructions (Signed)
Office will call you to arrange for the mammogram and ultrasound. Call if you do not hear from them within 1-2 weeks.

## 2015-01-21 NOTE — Telephone Encounter (Signed)
Appointment 01/24/15 @ 8:30am pt aware.

## 2015-01-21 NOTE — Telephone Encounter (Signed)
-----   Message from Anastasio Auerbach, MD sent at 01/21/2015 10:20 AM EST ----- Call and schedule bilateral 3-D mammogram and bilateral breast ultrasounds reference new onset bilateral breast tenderness, very dense breasts

## 2015-01-21 NOTE — Progress Notes (Signed)
Allison Lowery 1970/03/09 [id number]        44 y.o.  [id number] presents complaining of 2 months of bilateral breast tenderness. Most dramatic when she first wakes up and then eases off during the day. No palpable masses or galactorrhea. Does not appear related to her menses.  Past medical history,surgical history, problem list, medications, allergies, family history and social history were all reviewed and documented in the EPIC chart.  Directed ROS with pertinent positives and negatives documented in the history of present illness/assessment and plan.  Exam: Caryn Bee assistant Filed Vitals:   01/21/15 1003  BP: 112/64   General appearance:  Normal Both breast examined lying and sitting without masses, retractions, discharge, adenopathy. Both breasts are dense.  Assessment/Plan:  45 y.o. [id number] with 2 months of bilateral mastalgia. Exam is normal. Recommend baseline mammogram now and she is due with 3-D. We will also add bilateral ultrasounds. Options for management to include hormonal suppression such as low-dose oral contraceptives or danazol discussed. Patient not interested in doing anything at this point as long as the studies are normal. We will go ahead and check baseline prolactin.    Anastasio Auerbach MD, 10:24 AM 01/21/2015

## 2015-01-22 LAB — PROLACTIN: Prolactin: 5.7 ng/mL

## 2015-01-24 ENCOUNTER — Ambulatory Visit
Admission: RE | Admit: 2015-01-24 | Discharge: 2015-01-24 | Disposition: A | Discharge status: Home / Self Care | Payer: Commercial Managed Care - PPO | Source: Ambulatory Visit | Attending: Gynecology | Admitting: Gynecology

## 2015-01-24 DIAGNOSIS — N644 Mastodynia: Secondary | ICD-10-CM

## 2015-01-27 ENCOUNTER — Ambulatory Visit: Payer: Commercial Managed Care - PPO

## 2015-02-05 ENCOUNTER — Encounter: Payer: Self-pay | Admitting: Gynecology

## 2015-02-06 ENCOUNTER — Ambulatory Visit (INDEPENDENT_AMBULATORY_CARE_PROVIDER_SITE_OTHER): Payer: Commercial Managed Care - PPO | Admitting: Gynecology

## 2015-02-06 ENCOUNTER — Encounter: Payer: Self-pay | Admitting: Gynecology

## 2015-02-06 VITALS — BP 114/74 | Ht 64.0 in | Wt 129.0 lb

## 2015-02-06 DIAGNOSIS — Z01419 Encounter for gynecological examination (general) (routine) without abnormal findings: Secondary | ICD-10-CM | POA: Diagnosis not present

## 2015-02-06 DIAGNOSIS — Z13220 Encounter for screening for lipoid disorders: Secondary | ICD-10-CM | POA: Diagnosis not present

## 2015-02-06 LAB — CBC WITH DIFFERENTIAL/PLATELET
BASOS PCT: 0 % (ref 0–1)
Basophils Absolute: 0 10*3/uL (ref 0.0–0.1)
Eosinophils Absolute: 0.1 10*3/uL (ref 0.0–0.7)
Eosinophils Relative: 1 % (ref 0–5)
HCT: 36.4 % (ref 36.0–46.0)
Hemoglobin: 12.5 g/dL (ref 12.0–15.0)
LYMPHS ABS: 1.5 10*3/uL (ref 0.7–4.0)
Lymphocytes Relative: 26 % (ref 12–46)
MCH: 30.6 pg (ref 26.0–34.0)
MCHC: 34.3 g/dL (ref 30.0–36.0)
MCV: 89 fL (ref 78.0–100.0)
MPV: 11 fL (ref 8.6–12.4)
Monocytes Absolute: 0.6 10*3/uL (ref 0.1–1.0)
Monocytes Relative: 10 % (ref 3–12)
NEUTROS PCT: 63 % (ref 43–77)
Neutro Abs: 3.5 10*3/uL (ref 1.7–7.7)
PLATELETS: 190 10*3/uL (ref 150–400)
RBC: 4.09 MIL/uL (ref 3.87–5.11)
RDW: 13.6 % (ref 11.5–15.5)
WBC: 5.6 10*3/uL (ref 4.0–10.5)

## 2015-02-06 LAB — COMPREHENSIVE METABOLIC PANEL
ALBUMIN: 4.4 g/dL (ref 3.6–5.1)
ALT: 17 U/L (ref 6–29)
AST: 23 U/L (ref 10–30)
Alkaline Phosphatase: 34 U/L (ref 33–115)
BUN: 11 mg/dL (ref 7–25)
CALCIUM: 9.9 mg/dL (ref 8.6–10.2)
CHLORIDE: 101 mmol/L (ref 98–110)
CO2: 27 mmol/L (ref 20–31)
Creat: 0.65 mg/dL (ref 0.50–1.10)
Glucose, Bld: 83 mg/dL (ref 65–99)
POTASSIUM: 4.7 mmol/L (ref 3.5–5.3)
SODIUM: 137 mmol/L (ref 135–146)
Total Bilirubin: 0.6 mg/dL (ref 0.2–1.2)
Total Protein: 6.9 g/dL (ref 6.1–8.1)

## 2015-02-06 LAB — LIPID PANEL
CHOL/HDL RATIO: 2.6 ratio (ref <=5.0)
Cholesterol: 207 mg/dL — ABNORMAL HIGH (ref 125–200)
HDL: 81 mg/dL (ref >=46)
LDL CALC: 118 mg/dL (ref <130)
TRIGLYCERIDES: 41 mg/dL (ref <150)
VLDL: 8 mg/dL (ref <30)

## 2015-02-06 NOTE — Patient Instructions (Signed)

## 2015-02-06 NOTE — Progress Notes (Signed)
Allison Lowery May 01, 1970 [id number]        44 y.o.  [id number]  for annual exam.  Doing well without complaints  Past medical history,surgical history, problem list, medications, allergies, family history and social history were all reviewed and documented as reviewed in the EPIC chart.  ROS:  Performed with pertinent positives and negatives included in the history, assessment and plan.   Additional significant findings :  none   Exam: Caryn Bee assistant Filed Vitals:   02/06/15 0802  BP: 114/74  Height: 5' 4"  (1.626 m)  Weight: 129 lb (58.514 kg)   General appearance:  Normal affect, orientation and appearance. Skin: Grossly normal HEENT: Without gross lesions.  No cervical or supraclavicular adenopathy. Thyroid normal.  Lungs:  Clear without wheezing, rales or rhonchi Cardiac: RR, without RMG Abdominal:  Soft, nontender, without masses, guarding, rebound, organomegaly or hernia Breasts:  Examined lying and sitting without masses, retractions, discharge or axillary adenopathy. Pelvic:  Ext/BUS/vagina normal  Cervix normal  Uterus retroverted, normal size, shape and contour, midline and mobile nontender   Adnexa  Without masses or tenderness    Anus and perineum  Normal   Rectovaginal  Normal sphincter tone without palpated masses or tenderness.    Assessment/Plan:  45 y.o. [id number] female for annual exam with regular menses, tubal sterilization.   1. Mammography 01/2015. Continue with annual mammography when due. SBE monthly reviewed. 2. Pap smear/HPV negative 10/2013. No Pap smear done today.  No history of abnormal Pap smears. 3. Health maintenance. Baseline CBC, comprehensive metabolic panel, lipid profile, urinalysis ordered. Follow up in one year, sooner as needed.   Anastasio Auerbach MD, 8:40 AM 02/06/2015

## 2015-02-07 ENCOUNTER — Other Ambulatory Visit: Payer: Self-pay | Admitting: Gynecology

## 2015-02-07 DIAGNOSIS — E7800 Pure hypercholesterolemia, unspecified: Secondary | ICD-10-CM

## 2015-02-07 LAB — URINALYSIS W MICROSCOPIC + REFLEX CULTURE
Bacteria, UA: NONE SEEN [HPF]
Bilirubin Urine: NEGATIVE
CRYSTALS: NONE SEEN [HPF]
Casts: NONE SEEN [LPF]
GLUCOSE, UA: NEGATIVE
Hgb urine dipstick: NEGATIVE
NITRITE: NEGATIVE
Protein, ur: NEGATIVE
SPECIFIC GRAVITY, URINE: 1.024 (ref 1.001–1.035)
Yeast: NONE SEEN [HPF]
pH: 6 (ref 5.0–8.0)

## 2015-02-08 LAB — URINE CULTURE

## 2015-05-21 ENCOUNTER — Telehealth: Payer: Self-pay

## 2015-05-21 MED ORDER — DOXYCYCLINE HYCLATE 100 MG PO TABS: 100.0000 mg | ORAL_TABLET | Freq: Every day | ORAL | Status: DC

## 2015-05-21 NOTE — Telephone Encounter (Signed)
100 mg doxycycline daily 30

## 2015-05-21 NOTE — Telephone Encounter (Signed)
Patient called to ask if you would send Rx for her for Doxycycline.  She said you have done it in the past althought she states it has been quite awhile since you did.  She said her skin is breaking out really bad and when it does this seems to be the only thing to help.

## 2015-05-21 NOTE — Telephone Encounter (Signed)
I called patient because I received contraindication/allergy warning because she had hives with Tetracycline. Patient said she has taken Doxycycline since then and has no problems taking it. Rx sent.

## 2015-05-21 NOTE — Telephone Encounter (Signed)
Check to see how she takes it and okay to refill 1 month

## 2015-05-21 NOTE — Telephone Encounter (Signed)
She said it has been awhile. She does not remember dose but she takes one daily for a month.

## 2015-07-16 ENCOUNTER — Ambulatory Visit (INDEPENDENT_AMBULATORY_CARE_PROVIDER_SITE_OTHER): Payer: Commercial Managed Care - PPO | Admitting: Gynecology

## 2015-07-16 ENCOUNTER — Encounter: Payer: Self-pay | Admitting: Gynecology

## 2015-07-16 VITALS — BP 110/70

## 2015-07-16 DIAGNOSIS — N63 Unspecified lump in unspecified breast: Secondary | ICD-10-CM

## 2015-07-16 NOTE — Progress Notes (Signed)
    Allison Lowery 1970/09/10 [id number]        45 y.o.  [id number] presents with several week history of palpable area in her right breast.  Noticed on self breast exam. Is not tender or otherwise symptomatic. No nipple discharge. Had 3-D mammography 01/2015 which was negative. Does have a history of bilateral migratory breast tenderness that comes and goes felt to be physiologic.  Past medical history,surgical history, problem list, medications, allergies, family history and social history were all reviewed and documented in the EPIC chart.  Directed ROS with pertinent positives and negatives documented in the history of present illness/assessment and plan.  Exam: Caryn Bee assistant Filed Vitals:   07/16/15 1219  BP: 110/70   General appearance:  Normal Both breast examined lying and sitting without masses retractions discharge adenopathy. Patient's pointing to the 4 to 5:00 position today her breasts off areola right breast is the area she feels plica not feel a clear mass. She is thin and I am palpating along her rib in this area.  Assessment/Plan:  45 y.o. [id number] with patient perceived right breast mass. Physician exam negative.  Recent 3-D mammography 5 months ago negative. Will start with ultrasound over this area. Mammographic views per radiology if they feel needed. Reviewed differential to include fibrocystic changes or unperceived masses. If ultrasound negative them plan expectant management with follow up if she perceives this area to change at all. If otherwise then will triage based upon results.    Anastasio Auerbach MD, 12:31 PM 07/16/2015

## 2015-07-16 NOTE — Patient Instructions (Signed)
[facility] will call to arrange your ultrasound. Call my office if you do not hear from them within a week.

## 2015-07-17 ENCOUNTER — Telehealth: Payer: Self-pay | Admitting: *Deleted

## 2015-07-17 DIAGNOSIS — N6310 Unspecified lump in the right breast, unspecified quadrant: Secondary | ICD-10-CM

## 2015-07-17 NOTE — Telephone Encounter (Signed)
-----   Message from Anastasio Auerbach, MD sent at 07/16/2015 12:34 PM EDT ----- Schedule right breast ultrasound reference patient perceived right breast mass for the 5:00 position to finger breasts off areola. Physician exam is normal. Recent 3-D mammography 01/2015 normal.

## 2015-07-17 NOTE — Telephone Encounter (Signed)
Orders placed at breast center they will contact pt to schedule.

## 2015-07-21 NOTE — Telephone Encounter (Signed)
Appointment 08/04/15 @ 3:40pm

## 2015-07-25 ENCOUNTER — Other Ambulatory Visit: Payer: Self-pay

## 2015-12-17 ENCOUNTER — Other Ambulatory Visit: Payer: Self-pay

## 2015-12-17 ENCOUNTER — Other Ambulatory Visit: Payer: Self-pay | Admitting: Gynecology

## 2015-12-17 MED ORDER — DOXYCYCLINE HYCLATE 100 MG PO TABS: 100.0000 mg | ORAL_TABLET | Freq: Every day | ORAL | 0 refills | Status: DC

## 2015-12-17 NOTE — Telephone Encounter (Signed)
Patient said in the past you have prescribed Docycyline for her when her "face breaks out". She asked if you would refill it?

## 2015-12-17 NOTE — Telephone Encounter (Signed)
Patient called back and said she is fine taking Doxycycline. Rx sent.

## 2015-12-17 NOTE — Telephone Encounter (Signed)
Left message for patient to call.

## 2015-12-17 NOTE — Telephone Encounter (Signed)
Okay to refill. Just check with the patient because it flashes up and allergy to this and make sure she is okay taking doxycycline. #30 with 1 by mouth daily

## 2016-02-11 ENCOUNTER — Ambulatory Visit: Payer: Self-pay | Admitting: Family Medicine

## 2016-02-11 NOTE — Telephone Encounter (Signed)
Pt called back and was not happy she could not be seen today.  Pt has not been seen in 4 yrs. Advised pt I would be HAPPY to get her scheduled tomorrow with a physician that is accepting. Pt declined.  Pt previous Dr Raliegh Ip pt, and pt could only come in the afternoon for an appt.  Dr Raliegh Ip would not be here, and will not be back until Friday. Pt declined again to wait until then.   Offered pt several times an appt for Thurs, pt declined each time.

## 2016-03-04 ENCOUNTER — Telehealth: Payer: Self-pay | Admitting: *Deleted

## 2016-03-04 NOTE — Telephone Encounter (Signed)
Pt called requesting referral for cardiologist for heart palpitations , states PCP declined to see her,( per e-mail on 02/11/16 that was not what happened) I explained patient could go to urgent [facility] [place] to get established as a new patient and they could refer her to cardiologist if needed. As this is Gyn and we have not seen/ nor do we see patients for heart palpitations. Pt verbalized she understood.

## 2016-03-22 ENCOUNTER — Emergency Department (HOSPITAL_COMMUNITY): Payer: Commercial Managed Care - PPO

## 2016-03-22 ENCOUNTER — Encounter (HOSPITAL_COMMUNITY): Payer: Self-pay

## 2016-03-22 ENCOUNTER — Emergency Department (HOSPITAL_COMMUNITY)
Admission: EM | Admit: 2016-03-22 | Discharge: 2016-03-22 | Disposition: A | Discharge status: Home / Self Care | Payer: Commercial Managed Care - PPO | Attending: Emergency Medicine | Admitting: Emergency Medicine

## 2016-03-22 DIAGNOSIS — Z87891 Personal history of nicotine dependence: Secondary | ICD-10-CM | POA: Insufficient documentation

## 2016-03-22 DIAGNOSIS — M25551 Pain in right hip: Secondary | ICD-10-CM

## 2016-03-22 DIAGNOSIS — M67951 Unspecified disorder of synovium and tendon, right thigh: Secondary | ICD-10-CM

## 2016-03-22 DIAGNOSIS — Z91040 Latex allergy status: Secondary | ICD-10-CM | POA: Insufficient documentation

## 2016-03-22 DIAGNOSIS — M6798 Unspecified disorder of synovium and tendon, other site: Secondary | ICD-10-CM

## 2016-03-22 DIAGNOSIS — M7601 Gluteal tendinitis, right hip: Secondary | ICD-10-CM | POA: Diagnosis not present

## 2016-03-22 DIAGNOSIS — Z79899 Other long term (current) drug therapy: Secondary | ICD-10-CM | POA: Diagnosis not present

## 2016-03-22 LAB — CBC WITH DIFFERENTIAL/PLATELET
Basophils Absolute: 0 10*3/uL (ref 0.0–0.1)
Basophils Relative: 0 %
Eosinophils Absolute: 0.1 10*3/uL (ref 0.0–0.7)
Eosinophils Relative: 1 %
HEMATOCRIT: 33.7 % — AB (ref 36.0–46.0)
HEMOGLOBIN: 11.3 g/dL — AB (ref 12.0–15.0)
LYMPHS ABS: 1.2 10*3/uL (ref 0.7–4.0)
Lymphocytes Relative: 16 %
MCH: 29.7 pg (ref 26.0–34.0)
MCHC: 33.5 g/dL (ref 30.0–36.0)
MCV: 88.5 fL (ref 78.0–100.0)
Monocytes Absolute: 0.7 10*3/uL (ref 0.1–1.0)
Monocytes Relative: 10 %
NEUTROS PCT: 73 %
Neutro Abs: 5.4 10*3/uL (ref 1.7–7.7)
PLATELETS: 129 10*3/uL — AB (ref 150–400)
RBC: 3.81 MIL/uL — AB (ref 3.87–5.11)
RDW: 12.3 % (ref 11.5–15.5)
WBC: 7.4 10*3/uL (ref 4.0–10.5)

## 2016-03-22 LAB — BASIC METABOLIC PANEL
Anion gap: 7 (ref 5–15)
BUN: 16 mg/dL (ref 6–20)
CHLORIDE: 107 mmol/L (ref 101–111)
CO2: 22 mmol/L (ref 22–32)
Calcium: 9.1 mg/dL (ref 8.9–10.3)
Creatinine, Ser: 0.83 mg/dL (ref 0.44–1.00)
GFR calc non Af Amer: >60 mL/min (ref >60)
Glucose, Bld: 94 mg/dL (ref 65–99)
Potassium: 3.8 mmol/L (ref 3.5–5.1)
Sodium: 136 mmol/L (ref 135–145)

## 2016-03-22 LAB — I-STAT BETA HCG BLOOD, ED (MC, WL, AP ONLY): I-stat hCG, quantitative: <5 m[IU]/mL (ref <5)

## 2016-03-22 LAB — SEDIMENTATION RATE: SED RATE: 2 mm/h (ref 0–22)

## 2016-03-22 MED ORDER — ONDANSETRON HCL 4 MG/2ML IJ SOLN
4.0000 mg | Freq: Once | INTRAMUSCULAR | Status: AC
  Administered 2016-03-22: 4 mg via INTRAVENOUS
  Filled 2016-03-22: qty 2

## 2016-03-22 MED ORDER — HYDROMORPHONE HCL 2 MG/ML IJ SOLN
0.5000 mg | Freq: Once | INTRAMUSCULAR | Status: AC
  Administered 2016-03-22: 1 mg via INTRAVENOUS
  Filled 2016-03-22: qty 1

## 2016-03-22 MED ORDER — KETOROLAC TROMETHAMINE 15 MG/ML IJ SOLN
15.0000 mg | Freq: Once | INTRAMUSCULAR | Status: AC
  Administered 2016-03-22: 15 mg via INTRAVENOUS
  Filled 2016-03-22: qty 1

## 2016-03-22 MED ORDER — METHYLPREDNISOLONE 4 MG PO TBPK: ORAL_TABLET | ORAL | 0 refills | Status: DC

## 2016-03-22 MED ORDER — HYDROCODONE-ACETAMINOPHEN 5-325 MG PO TABS: 1.0000 | ORAL_TABLET | ORAL | 0 refills | Status: DC | PRN

## 2016-03-22 MED ORDER — DEXAMETHASONE SODIUM PHOSPHATE 10 MG/ML IJ SOLN
10.0000 mg | Freq: Once | INTRAMUSCULAR | Status: AC
  Administered 2016-03-22: 10 mg via INTRAVENOUS
  Filled 2016-03-22: qty 1

## 2016-03-22 MED ORDER — HYDROMORPHONE HCL 2 MG/ML IJ SOLN
1.0000 mg | Freq: Once | INTRAMUSCULAR | Status: AC
  Administered 2016-03-22: 1 mg via INTRAVENOUS
  Filled 2016-03-22: qty 1

## 2016-03-22 MED ORDER — NAPROXEN 375 MG PO TABS: 375.0000 mg | ORAL_TABLET | Freq: Two times a day (BID) | ORAL | 0 refills | Status: DC | PRN

## 2016-03-22 MED ORDER — GADOBENATE DIMEGLUMINE 529 MG/ML IV SOLN
12.0000 mL | Freq: Once | INTRAVENOUS | Status: AC | PRN
  Administered 2016-03-22: 12 mL via INTRAVENOUS

## 2016-03-22 NOTE — ED Notes (Signed)
Marnee Guarneri MD regarding pt's pain and sudden onset weakness/numbness. MD to see pt to rule out need to call code stroke.

## 2016-03-22 NOTE — ED Notes (Signed)
ED Provider at bedside. 

## 2016-03-22 NOTE — ED Triage Notes (Signed)
Pt complaining of R hip pain. Pt states onset when she woke up this AM. Pt denies any falls/trauma. Pt also complaining of L arm pain and numbness. Pt also complaining of dizziness. Pt states whole L arm numb at this time.

## 2016-03-22 NOTE — Progress Notes (Signed)
Orthopedic Tech Progress Note Patient Details:  MERRISSA GIACOBBE 07/12/1970 [id number]  Ortho Devices Type of Ortho Device: Crutches Ortho Device/Splint Location: Applied and trained pt for curtches.  Pt injury located right Hip (right hip).  Pt tolerated training well. Ambulated well.  Ortho Device/Splint Interventions: Application, Adjustment   Kristopher Oppenheim 03/22/2016, 9:20 AM

## 2016-03-22 NOTE — ED Provider Notes (Signed)
[facility] DEPT Provider Note   CSN: [id number] Arrival date & time: 03/22/16  0307  By signing my name below, I, Oleh Genin, attest that this documentation has been prepared under the direction and in the presence of Delora Fuel, MD. Electronically Signed: Oleh Genin, Scribe. 03/22/16. 3:34 AM.   History   Chief Complaint Chief Complaint  Patient presents with  . Hip Pain  . Arm Pain  . Dizziness    HPI Allison Lowery is a 46 y.o. female who presents to the ED for evaluation of hip pain and L arm numbness. This patient states that yesterday morning she woke with mild R hip pain that gradually worsened. However approximately 6 hours ago her pain became intolerable; she is not weight bearing on that side currently. Approximately 1 hour ago she also developed decreased sensation involving the L arm. No focal weaknesses. Denies fevers.   The history is provided by the patient. No language interpreter was used.  Hip Pain  This is a new problem. The current episode started 6 to 12 hours ago. The problem occurs constantly. The problem has been gradually worsening. The symptoms are aggravated by walking and standing. Nothing relieves the symptoms. Treatments tried: OTC pain therapy. The treatment provided no relief.    Past Medical History:  Diagnosis Date  . CHEST PAIN, PLEURITIC 03/26/2009  . MVP (mitral valve prolapse)   . Palpitations 10/30/2007    Patient Active Problem List   Diagnosis Date Noted  . Left knee pain 12/05/2014  . Acute medial meniscal tear 12/05/2014  . Mitral valve prolapse 10/05/2010  . PALPITATIONS 10/30/2007    Past Surgical History:  Procedure Laterality Date  . BLADDER SUSPENSION  07/04/2008   DR. SCOTT MACDIARMID  . DILATION AND CURETTAGE OF UTERUS    . IUD REMOVAL N/A 12/28/2013   Procedure: INTRAUTERINE DEVICE (IUD) REMOVAL;  Surgeon: Anastasio Auerbach, MD;  Location: [facility];  Service: Gynecology;  Laterality: N/A;  .  LAPAROSCOPIC BILATERAL SALPINGECTOMY Bilateral 12/28/2013   Procedure: LAPAROSCOPIC BILATERAL SALPINGECTOMY ;  Surgeon: Anastasio Auerbach, MD;  Location: [facility];  Service: Gynecology;  Laterality: Bilateral;    OB History    Gravida Para Term Preterm AB Living   3 2     1 2    SAB TAB Ectopic Multiple Live Births                   Home Medications    Prior to Admission medications   Medication Sig Start Date End Date Taking? Authorizing Provider  Cholecalciferol (VITAMIN D PO) Take by mouth.    Historical Provider, MD  doxycycline (VIBRA-TABS) 100 MG tablet Take 1 tablet (100 mg total) by mouth daily. 12/17/15   Anastasio Auerbach, MD  Multiple Vitamins-Minerals (MULTIVITAMIN WITH MINERALS) tablet Take 1 tablet by mouth daily.    Historical Provider, MD  Omega-3 Fatty Acids (FISH OIL PO) Take by mouth.    Historical Provider, MD  TURMERIC PO Take by mouth.    Historical Provider, MD    Family History Family History  Problem Relation Age of Onset  . Hypertension Mother   . Thyroid disease Mother     Social History Social History  Substance Use Topics  . Smoking status: Former Smoker    Quit date: 01/18/1998  . Smokeless tobacco: Never Used  . Alcohol use 1.2 oz/week    2 Standard drinks or equivalent per week     Allergies   Percocet [oxycodone-acetaminophen]; Amoxicillin; Erythromycin;  Penicillin g benzathine; Sulfamethoxazole-trimethoprim; Tetracycline; and Latex   Review of Systems Review of Systems  Constitutional: Negative for fever.  Musculoskeletal:       R hip pain  Neurological: Positive for numbness. Negative for weakness.  All other systems reviewed and are negative.    Physical Exam Updated Vital Signs BP 139/75 (BP Location: Right Arm)   Pulse 95   Temp 99.2 F (37.3 C)   Resp 20   SpO2 100%   Physical Exam  Constitutional: She is oriented to person, place, and time. She appears well-developed and well-nourished.  Appears to be in pain.    HENT:  Head: Normocephalic and atraumatic.  Eyes: EOM are normal. Pupils are equal, round, and reactive to light.  Neck: Normal range of motion. Neck supple. No JVD present.  Cardiovascular: Normal rate, regular rhythm and normal heart sounds.   No murmur heard. Pulmonary/Chest: Effort normal and breath sounds normal. She has no wheezes. She has no rales. She exhibits no tenderness.  Abdominal: Soft. Bowel sounds are normal. She exhibits no distension and no mass. There is no tenderness.  Musculoskeletal: She exhibits no edema.  Tenderness over the R lateral thigh and R hip. Pain on any range of motion in the R hip. Slight soft tissue swelling in the R thigh and R hip.  Lymphadenopathy:    She has no cervical adenopathy.  Neurological: She is alert and oriented to person, place, and time. No cranial nerve deficit. She exhibits normal muscle tone. Coordination normal.  Skin: Skin is warm and dry. No rash noted.  Psychiatric: She has a normal mood and affect. Her behavior is normal. Judgment and thought content normal.  Nursing note and vitals reviewed.    ED Treatments / Results  DIAGNOSTIC STUDIES: Oxygen Saturation is 100 percent on room air which is normal by my interpretation.    COORDINATION OF CARE: 3:30 AM Discussed treatment plan with pt at bedside and pt agreed to plan.  Labs (all labs ordered are listed, but only abnormal results are displayed) Labs Reviewed  CBC WITH DIFFERENTIAL/PLATELET - Abnormal; Notable for the following:       Result Value   RBC 3.81 (*)    Hemoglobin 11.3 (*)    HCT 33.7 (*)    Platelets 129 (*)    All other components within normal limits  BASIC METABOLIC PANEL  SEDIMENTATION RATE  I-STAT BETA HCG BLOOD, ED ([facility], [facility], [facility] ONLY)    Radiology Dg Hip Unilat W Or Wo Pelvis 2-3 Views Right  Result Date: 03/22/2016 CLINICAL DATA:  Severe right hip pain EXAM: DG HIP (WITH OR WITHOUT PELVIS) 2-3V RIGHT COMPARISON:  None. FINDINGS: There is no  evidence of hip fracture or dislocation. There is no evidence of arthropathy or other focal bone abnormality. IMPRESSION: Normal right hip. Electronically Signed   By: Ulyses Jarred M.D.   On: 03/22/2016 05:09    Procedures Procedures (including critical care time) EMERGENCY DEPARTMENT US SOFT TISSUE INTERPRETATION "Study: Limited Soft Tissue Ultrasound"  INDICATIONS: Pain Multiple views of the body part were obtained in real-time with a multi-frequency linear probe  PERFORMED BY: Myself IMAGES ARCHIVED?: Yes SIDE:Right  BODY PART:Lower extremity INTERPRETATION:  No abcess noted   .  Medications Ordered in ED Medications  HYDROmorphone (DILAUDID) injection 1 mg (1 mg Intravenous Given 03/22/16 0402)  ondansetron (ZOFRAN) injection 4 mg (4 mg Intravenous Given 03/22/16 0359)  HYDROmorphone (DILAUDID) injection 1 mg (1 mg Intravenous Given 03/22/16 0508)  gadobenate dimeglumine (  MULTIHANCE) injection 12 mL (12 mLs Intravenous Contrast Given 03/22/16 0654)     Initial Impression / Assessment and Plan / ED Course  I have reviewed the triage vital signs and the nursing notes.  Pertinent labs & imaging results that were available during my care of the patient were reviewed by me and considered in my medical decision making (see chart for details).  Left hip pain of uncertain cause. No findings to suggest stroke. Bedside ultrasound showed no abnormal fluid collections. Of note, hip joint was not directly evaluated.  Laboratory workup was unremarkable including normal sedimentation rate. Hip x-rays were unremarkable. Pain was mainly relieved with hydromorphone-total dose 2 mg, but still has significant severe pain with any movement. MRI has been ordered. Case signed out to Dr. Wilson Singer.  Final Clinical Impressions(s) / ED Diagnoses   Final diagnoses:  Right hip pain    New Prescriptions New Prescriptions   No medications on file     Delora Fuel, MD 16/38/46 [id number]

## 2016-03-22 NOTE — ED Notes (Signed)
Per Roxanne Mins MD, no need for code stroke/neuor work up, states he thinks numbness is related to R hip pain. States difficult to perform  Exam d/t pain.

## 2016-03-22 NOTE — ED Provider Notes (Signed)
Medical screening examination/treatment/procedure(s) were conducted as a shared visit with non-physician practitioner(s) and myself.  I personally evaluated the patient during the encounter.   EKG Interpretation None       MRI as below. I'm not sure as to exact etiology though. She denies trauma/overuse/strain. Plan pain meds/anti-inflammatories. Crutches. Ortho FU.   Mr Hip Right W Wo Contrast  Result Date: 03/22/2016 CLINICAL DATA:  Severe right hip pain. EXAM: MRI OF THE RIGHT HIP WITHOUT AND WITH CONTRAST TECHNIQUE: Multiplanar, multisequence MR imaging was performed both before and after administration of intravenous contrast. CONTRAST:  3m MULTIHANCE GADOBENATE DIMEGLUMINE 529 MG/ML IV SOLN COMPARISON:  Radiographs dated 03/22/2016 FINDINGS: Muscles and tendons Muscles and tendons: There is a partial tear of the distal right gluteus medius muscle and tendon at and proximal to its insertion on the greater trochanter of the proximal right femur. There is enhancement after contrast administration. There is inflammation in the adjacent greater trochanteric bursa. Bones: Normal. Articular cartilage and labrum Articular cartilage:  Normal. Labrum:  Normal. Joint or bursal effusion Joint effusion:  None Bursae: Inflammation of the right greater trochanteric bursa adjacent to the right gluteus medius tear. Other findings Miscellaneous:  None IMPRESSION: Partial tear of the distal right gluteus medius muscle and tendon with secondary inflammation of the adjacent greater trochanteric bursa. Electronically Signed   By: JLorriane ShireM.D.   On: 03/22/2016 07:34   Dg Hip Unilat W Or Wo Pelvis 2-3 Views Right  Result Date: 03/22/2016 CLINICAL DATA:  Severe right hip pain EXAM: DG HIP (WITH OR WITHOUT PELVIS) 2-3V RIGHT COMPARISON:  None. FINDINGS: There is no evidence of hip fracture or dislocation. There is no evidence of arthropathy or other focal bone abnormality. IMPRESSION: Normal right hip.  Electronically Signed   By: KUlyses JarredM.D.   On: 03/22/2016 05:09     SVirgel Manifold MD 03/22/16 [phone]

## 2016-03-22 NOTE — Discharge Instructions (Signed)
Follow-up with your orthopedist.

## 2016-08-24 ENCOUNTER — Other Ambulatory Visit: Payer: Self-pay | Admitting: Research Study

## 2016-08-24 DIAGNOSIS — Z006 Encounter for examination for normal comparison and control in clinical research program: Secondary | ICD-10-CM

## 2016-08-25 ENCOUNTER — Other Ambulatory Visit: Payer: Self-pay

## 2016-10-07 ENCOUNTER — Encounter: Payer: Self-pay | Admitting: Internal Medicine

## 2017-04-18 ENCOUNTER — Encounter: Payer: Self-pay | Admitting: Gynecology

## 2017-04-18 ENCOUNTER — Ambulatory Visit (INDEPENDENT_AMBULATORY_CARE_PROVIDER_SITE_OTHER): Payer: 59 | Admitting: Gynecology

## 2017-04-18 VITALS — BP 116/74 | Ht 64.0 in | Wt 139.0 lb

## 2017-04-18 DIAGNOSIS — N924 Excessive bleeding in the premenopausal period: Secondary | ICD-10-CM | POA: Diagnosis not present

## 2017-04-18 DIAGNOSIS — R339 Retention of urine, unspecified: Secondary | ICD-10-CM | POA: Diagnosis not present

## 2017-04-18 DIAGNOSIS — Z13220 Encounter for screening for lipoid disorders: Secondary | ICD-10-CM

## 2017-04-18 DIAGNOSIS — Z01419 Encounter for gynecological examination (general) (routine) without abnormal findings: Secondary | ICD-10-CM

## 2017-04-18 LAB — HM PAP SMEAR

## 2017-04-18 NOTE — Addendum Note (Signed)
Addended by: Nelva Nay on: 04/18/2017 11:24 AM   Modules accepted: Orders

## 2017-04-18 NOTE — Patient Instructions (Signed)
Follow-up for the ultrasound as scheduled

## 2017-04-18 NOTE — Progress Notes (Signed)
    Allison Lowery Oct 01, 1970 [id number]        46 y.o.  [id number] for annual gynecologic exam.  Also complaining of:  1. Menses getting heavier over the last 6 months or so.  Having to change tampons every hour during her heaviest flows.  Lasting up to 7 days.  Cramping no worse over time.  Does note some spotting in between her menses also.  No significant menopausal symptoms such as hot flushes or night sweats. 2. Feels like she is having urinary retention after voiding.  Status post bladder sling in the past by Dr. Matilde Sprang.  Feels like she has to shift positions to completely empty her bladder.  Also notes some involuntary loss of urine after standing with dribbling uncontrollably.  No UTI symptoms like frequency, dysuria, urgency, low back pain.  Past medical history,surgical history, problem list, medications, allergies, family history and social history were all reviewed and documented as reviewed in the EPIC chart.  ROS:  Performed with pertinent positives and negatives included in the history, assessment and plan.   Additional significant findings : None   Exam: Caryn Bee assistant Vitals:   04/18/17 1027  BP: 116/74  Weight: 139 lb (63 kg)  Height: 5' 4"  (1.626 m)   Body mass index is 23.86 kg/m.  General appearance:  Normal affect, orientation and appearance. Skin: Grossly normal HEENT: Without gross lesions.  No cervical or supraclavicular adenopathy. Thyroid normal.  Lungs:  Clear without wheezing, rales or rhonchi Cardiac: RR, without RMG Abdominal:  Soft, nontender, without masses, guarding, rebound, organomegaly or hernia Breasts:  Examined lying and sitting without masses, retractions, discharge or axillary adenopathy. Pelvic:  Ext, BUS, Vagina: Normal  Cervix: Normal  Uterus: Retroverted, normal size, shape and contour, midline and mobile nontender   Adnexa: Without masses or tenderness    Anus and perineum: Normal   Rectovaginal: Normal sphincter tone  without palpated masses or tenderness.    Assessment/Plan:  47 y.o. [id number] female for annual gynecologic exam with regular heavy menses, tubal sterilization.   1. Menorrhagia over the past 6 months seems to be getting worse.  Reviewed differential to include physiologic with age-related changes, adenomyosis, structural abnormality such as polyps or submucous myomas.  Recommended patient start with sonohysterogram to rule out nonpalpable abnormalities and for endometrial cavity assessment.  Patient will schedule in follow-up for this.  Check baseline CBC and TSH due to the menorrhagia. 2. Urinary retention and incontinence.  Status post sling in the past.  Will check urine analysis today to rule out subclinical UTI.  Recommend patient follow-up with Dr. Rogue Bussing for further evaluation and treatment as he did her original procedure and she agrees to call and arrange. 3. Mammography 2017.  Recommend patient call and schedule screening mammogram now and she agrees to do so.  Breast exam normal today. 4. Pap smear/HPV 2015.  Pap smear done today due to history of spotting.  No history of abnormal Pap smears previously. 5. Health maintenance.  Baseline comprehensive metabolic panel and lipid profile done along with her CBC and TSH.  Follow-up for sonohysterogram.   Anastasio Auerbach MD, 11:01 AM 04/18/2017

## 2017-04-19 LAB — COMPREHENSIVE METABOLIC PANEL
AG Ratio: 1.8 (calc) (ref 1.0–2.5)
ALT: 10 U/L (ref 6–29)
AST: 15 U/L (ref 10–30)
Albumin: 4.5 g/dL (ref 3.6–5.1)
Alkaline phosphatase (APISO): 51 U/L (ref 33–115)
BUN: 12 mg/dL (ref 7–25)
CO2: 26 mmol/L (ref 20–32)
Calcium: 9.5 mg/dL (ref 8.6–10.2)
Chloride: 104 mmol/L (ref 98–110)
Creat: 0.72 mg/dL (ref 0.50–1.10)
Globulin: 2.5 g/dL (calc) (ref 1.9–3.7)
Glucose, Bld: 87 mg/dL (ref 65–99)
Potassium: 4.7 mmol/L (ref 3.5–5.3)
Sodium: 139 mmol/L (ref 135–146)
Total Bilirubin: 0.5 mg/dL (ref 0.2–1.2)
Total Protein: 7 g/dL (ref 6.1–8.1)

## 2017-04-19 LAB — CBC WITH DIFFERENTIAL/PLATELET
Basophils Absolute: 22 cells/uL (ref 0–200)
Basophils Relative: 0.5 %
Eosinophils Absolute: 22 cells/uL (ref 15–500)
Eosinophils Relative: 0.5 %
HCT: 36.5 % (ref 35.0–45.0)
Hemoglobin: 12.5 g/dL (ref 11.7–15.5)
Lymphs Abs: 1373 cells/uL (ref 850–3900)
MCH: 30.2 pg (ref 27.0–33.0)
MCHC: 34.2 g/dL (ref 32.0–36.0)
MCV: 88.2 fL (ref 80.0–100.0)
MPV: 12.3 fL (ref 7.5–12.5)
Monocytes Relative: 9.6 %
Neutro Abs: 2561 cells/uL (ref 1500–7800)
Neutrophils Relative %: 58.2 %
Platelets: 160 10*3/uL (ref 140–400)
RBC: 4.14 10*6/uL (ref 3.80–5.10)
RDW: 11.8 % (ref 11.0–15.0)
Total Lymphocyte: 31.2 %
WBC mixed population: 422 cells/uL (ref 200–950)
WBC: 4.4 10*3/uL (ref 3.8–10.8)

## 2017-04-19 LAB — URINALYSIS, COMPLETE W/RFL CULTURE
BACTERIA UA: NONE SEEN /HPF
Bilirubin Urine: NEGATIVE
GLUCOSE, UA: NEGATIVE
HGB URINE DIPSTICK: NEGATIVE
Hyaline Cast: NONE SEEN /LPF
KETONES UR: NEGATIVE
LEUKOCYTE ESTERASE: NEGATIVE
Nitrites, Initial: NEGATIVE
PH: 7.5 (ref 5.0–8.0)
Protein, ur: NEGATIVE
RBC / HPF: NONE SEEN /HPF (ref 0–2)
Specific Gravity, Urine: 1.016 (ref 1.001–1.03)
Squamous Epithelial / LPF: NONE SEEN /HPF (ref <=5)
WBC UA: NONE SEEN /HPF (ref 0–5)

## 2017-04-19 LAB — PAP IG W/ RFLX HPV ASCU

## 2017-04-19 LAB — NO CULTURE INDICATED

## 2017-04-19 LAB — URINE CULTURE
MICRO NUMBER:: 90400309
SPECIMEN QUALITY:: ADEQUATE

## 2017-04-19 LAB — TSH: TSH: 0.81 m[IU]/L

## 2017-04-19 LAB — LIPID PANEL
CHOLESTEROL: 188 mg/dL (ref <200)
HDL: 76 mg/dL (ref >50)
LDL CHOLESTEROL (CALC): 100 mg/dL — AB
Non-HDL Cholesterol (Calc): 112 mg/dL (calc) (ref <130)
TRIGLYCERIDES: 40 mg/dL (ref <150)
Total CHOL/HDL Ratio: 2.5 (calc) (ref <5.0)

## 2017-05-05 ENCOUNTER — Telehealth: Payer: Self-pay | Admitting: *Deleted

## 2017-05-05 NOTE — Telephone Encounter (Signed)
I was informed by the Apt desk that patient cancelled [facility] due to the cost of the procedure going to her deductible.  I let Dr Phineas Real know and his response was Fontaine, Belinda Block, MD  Alen Blew, CMA        Options include regular ultrasound but no sonohysterogram, endometrial biopsy alone or neither test with the patient's understanding that we are not excluding significant issues. The likelihood of something bad like cancer is very low   I left a message for the patient to return my call to discuss options since cancelled [facility].  KW CMA

## 2017-05-10 ENCOUNTER — Other Ambulatory Visit: Payer: 59

## 2017-05-10 ENCOUNTER — Ambulatory Visit: Payer: 59 | Admitting: Gynecology

## 2017-05-16 NOTE — Telephone Encounter (Signed)
Pt left a message for me returning my call. I called back and left a message for her with another number to call to get me to the phone when she called. KW CMA

## 2017-05-17 ENCOUNTER — Other Ambulatory Visit: Payer: 59

## 2017-05-17 ENCOUNTER — Ambulatory Visit: Payer: 59 | Admitting: Gynecology

## 2017-05-23 ENCOUNTER — Encounter: Payer: Self-pay | Admitting: *Deleted

## 2017-05-23 NOTE — Telephone Encounter (Signed)
I sent pt a My Chart message as we have played phone tag a couple of times. KW CMA

## 2017-06-01 ENCOUNTER — Encounter: Payer: Self-pay | Admitting: *Deleted

## 2017-06-01 NOTE — Telephone Encounter (Signed)
A letter was mailed to patient which included options of cancelled [facility]. KW CMA

## 2018-05-23 ENCOUNTER — Other Ambulatory Visit: Payer: Self-pay

## 2018-05-23 ENCOUNTER — Ambulatory Visit: Payer: BLUE CROSS/BLUE SHIELD | Admitting: Gynecology

## 2018-05-23 ENCOUNTER — Encounter: Payer: Self-pay | Admitting: Gynecology

## 2018-05-23 VITALS — BP 118/74

## 2018-05-23 DIAGNOSIS — N920 Excessive and frequent menstruation with regular cycle: Secondary | ICD-10-CM

## 2018-05-23 LAB — CBC WITH DIFFERENTIAL/PLATELET
Absolute Monocytes: 435 cells/uL (ref 200–950)
Basophils Absolute: 22 cells/uL (ref 0–200)
Basophils Relative: 0.4 %
Eosinophils Absolute: 22 cells/uL (ref 15–500)
Eosinophils Relative: 0.4 %
HCT: 31.3 % — ABNORMAL LOW (ref 35.0–45.0)
Hemoglobin: 10.7 g/dL — ABNORMAL LOW (ref 11.7–15.5)
Lymphs Abs: 1370 cells/uL (ref 850–3900)
MCH: 30.5 pg (ref 27.0–33.0)
MCHC: 34.2 g/dL (ref 32.0–36.0)
MCV: 89.2 fL (ref 80.0–100.0)
MPV: 12 fL (ref 7.5–12.5)
Monocytes Relative: 7.9 %
Neutro Abs: 3652 cells/uL (ref 1500–7800)
Neutrophils Relative %: 66.4 %
Platelets: 159 10*3/uL (ref 140–400)
RBC: 3.51 10*6/uL — ABNORMAL LOW (ref 3.80–5.10)
RDW: 11.9 % (ref 11.0–15.0)
Total Lymphocyte: 24.9 %
WBC: 5.5 10*3/uL (ref 3.8–10.8)

## 2018-05-23 MED ORDER — MEGESTROL ACETATE 20 MG PO TABS: ORAL_TABLET | ORAL | 0 refills | Status: DC

## 2018-05-23 NOTE — Addendum Note (Signed)
Addended by: Anastasio Auerbach on: 05/23/2018 04:11 PM   Modules accepted: Orders

## 2018-05-23 NOTE — Progress Notes (Addendum)
    Allison Lowery 11-Oct-1970 [id number]        48 y.o.  [id number] presents complaining of the onset of her menses this morning where she was bleeding through tampons and pads together that lasted for an hour or 2 and now has decreased.  She does have a history of heavy menses and last year was recommended for sonohysterogram but she canceled this appointment.  Her menses are regular without intermenstrual bleeding.  Tubal sterilization.  Past medical history,surgical history, problem list, medications, allergies, family history and social history were all reviewed and documented in the EPIC chart.  Directed ROS with pertinent positives and negatives documented in the history of present illness/assessment and plan.  Exam: Caryn Bee assistant Vitals:   05/23/18 1205  BP: 118/74   General appearance:  Normal Abdomen soft nontender without masses guarding rebound Pelvic external BUS vagina with scant bleeding.  Cervix normal.  Uterus retroverted grossly normal midline mobile nontender.  Adnexa without masses or tenderness.  Assessment/Plan:  48 y.o. [id number] with history of menorrhagia with exceptionally heavy menses starting this morning although now has tapered off.  I again recommended she schedule a sonohysterogram to rule out nonpalpable abnormalities such as endometrial polyps, submucous myomas or other pathology.  Patient agrees to schedule.  Check baseline CBC today also.  Addendum:  Patient calls back at 4 PM with increased bleeding.  Will start megace 40 mg now the 20 mg tid until bleeding slows then one pill daily until scheduled ultrasound next week.  Anastasio Auerbach MD, 12:25 PM 05/23/2018

## 2018-05-23 NOTE — Patient Instructions (Signed)
Follow-up for the ultrasound as scheduled.

## 2018-05-30 ENCOUNTER — Other Ambulatory Visit: Payer: Self-pay

## 2018-06-01 ENCOUNTER — Other Ambulatory Visit: Payer: Self-pay | Admitting: Gynecology

## 2018-06-01 ENCOUNTER — Other Ambulatory Visit: Payer: Self-pay

## 2018-06-01 ENCOUNTER — Encounter: Payer: Self-pay | Admitting: Gynecology

## 2018-06-01 ENCOUNTER — Ambulatory Visit (INDEPENDENT_AMBULATORY_CARE_PROVIDER_SITE_OTHER): Payer: BLUE CROSS/BLUE SHIELD | Admitting: Gynecology

## 2018-06-01 ENCOUNTER — Ambulatory Visit (INDEPENDENT_AMBULATORY_CARE_PROVIDER_SITE_OTHER): Payer: BLUE CROSS/BLUE SHIELD

## 2018-06-01 VITALS — BP 114/64

## 2018-06-01 DIAGNOSIS — N920 Excessive and frequent menstruation with regular cycle: Secondary | ICD-10-CM

## 2018-06-01 DIAGNOSIS — D500 Iron deficiency anemia secondary to blood loss (chronic): Secondary | ICD-10-CM

## 2018-06-01 MED ORDER — MEGESTROL ACETATE 20 MG PO TABS: 20.0000 mg | ORAL_TABLET | Freq: Two times a day (BID) | ORAL | 5 refills | Status: DC

## 2018-06-01 NOTE — Patient Instructions (Signed)
Follow-up as needed for the heavy bleeding.  For now we will go with the Megace.

## 2018-06-01 NOTE — Addendum Note (Signed)
Addended by: Nelva Nay on: 06/01/2018 09:27 AM   Modules accepted: Orders

## 2018-06-01 NOTE — Progress Notes (Signed)
    Allison Lowery 01/24/1970 [id number]        48 y.o.  [id number] presents for sonohysterogram with history of worsening menses.  Most recently has had very heavy menses with passage of large clots and bleedthrough episodes.  Exam was negative.  Recent hemoglobin 10.7.  Tubal sterilization.  Past medical history,surgical history, problem list, medications, allergies, family history and social history were all reviewed and documented in the EPIC chart.  Directed ROS with pertinent positives and negatives documented in the history of present illness/assessment and plan.  Exam: Ivin Booty assistant BP 114/64 General appearance:  Normal Abdomen soft nontender without masses guarding rebound Pelvic external BUS vagina normal.  Cervix normal.  Uterus retroverted normal size shape and contour.  Adnexa without masses or tenderness.  Ultrasound transvaginal shows uterus retroverted normal in size with no myometrial abnormalities.  Endometrial echo thin at 2.4 mm.  Right and left ovaries normal.  Cul-de-sac negative.  Sonohysterogram performed, sterile technique, easy catheter introduction, good distention with no abnormalities seen.  Endometrial biopsy taken.  Patient tolerated well.  Assessment/Plan:  48 y.o. [id number] with worsening menses.  Sonohysterogram was negative.  Patient will follow-up for biopsy results.  Options for management were reviewed to include hormonal manipulation such as progesterone only, low-dose oral contraceptives, trial of Lysteda, Mirena IUD, endometrial ablation, hysterectomy.  The pros and cons of each choice were reviewed with the patient.  At this point the patient would like to monitor her cycles.  She will use Megace 20 mg twice daily if they start to increase in flow to help control this intermittently.  She will follow-up if she changes her mind and wants to pursue alternative managements.  Otherwise she will follow-up for annual exam as scheduled.   Anastasio Auerbach  MD, 8:49 AM 06/01/2018

## 2018-06-05 ENCOUNTER — Encounter: Payer: Self-pay | Admitting: Gynecology

## 2018-09-08 ENCOUNTER — Telehealth: Payer: Self-pay

## 2018-09-08 NOTE — Telephone Encounter (Signed)
Check NovaSure and HerOption

## 2018-09-08 NOTE — Telephone Encounter (Signed)
Patient said she had discussed options with you to manage her very heavy bleeding.  She is interested in ablation.  She wants me to check cost for her.  Just confirming the type of ablation you would schedule (I assume Her Option ablation?).

## 2018-09-08 NOTE — Telephone Encounter (Signed)
You can tell her we looked at both options but it sounds like her option is the best choice and she can go ahead and schedule that with [facility]

## 2018-09-08 NOTE — Telephone Encounter (Signed)
Her Option is a $40 copayment then pays 100%.  Novasure will go toward her ded and co-insurance and her prepaymt amount to [facility] will be $750.00 and then she will have hospital bill, anesthesia bill, etc.  Should I tell her you are considering both?

## 2018-09-08 NOTE — Telephone Encounter (Signed)
Spoke with patient. She would like to schedule Her Option Ablation.  However, she said her periods are irregular and cannot predict when she will start. This period started past Monday and was not due until next week.   How should I proceed with scheduling in light of irregular cycle?

## 2018-09-11 NOTE — Telephone Encounter (Signed)
I would start on progesterone with her next menses i.e. Megace 20 mg daily and then she can schedule anytime after that

## 2018-09-12 NOTE — Telephone Encounter (Signed)
I called patient and per DPR access note on file I left message and told patient that Dr. Loetta Rough recommend she call me with first day of her next period and he will start her on Megace 20 mg daily and then we will schedule.

## 2018-10-10 ENCOUNTER — Encounter: Payer: Self-pay | Admitting: Gynecology

## 2018-10-19 ENCOUNTER — Telehealth: Payer: Self-pay

## 2018-10-19 NOTE — Telephone Encounter (Signed)
I called patient to follow up because we had discussed Her Option Ablation and she was to call me with first day of menses.  She said she has decided not to do it. She found out about Dr. Loetta Rough retirement and is going to see another Gyn.

## 2018-12-04 ENCOUNTER — Other Ambulatory Visit (HOSPITAL_COMMUNITY)
Admission: RE | Admit: 2018-12-04 | Discharge: 2018-12-04 | Disposition: A | Discharge status: Home / Self Care | Payer: BC Managed Care – PPO | Source: Ambulatory Visit | Attending: Obstetrics and Gynecology | Admitting: Obstetrics and Gynecology

## 2018-12-04 DIAGNOSIS — Z20828 Contact with and (suspected) exposure to other viral communicable diseases: Secondary | ICD-10-CM | POA: Insufficient documentation

## 2018-12-04 DIAGNOSIS — Z01812 Encounter for preprocedural laboratory examination: Secondary | ICD-10-CM | POA: Insufficient documentation

## 2018-12-04 NOTE — Patient Instructions (Addendum)
YOU ARE SCHEDULED FOR A COVID TEST _________@____________ . THIS TEST MUST BE DONE BEFORE SURGERY. GO TO  [address], [place], [address] AND REMAIN IN YOUR CAR, THIS IS A DRIVE UP TEST. ONCE YOUR COVID TEST IS DONE PLEASE FOLLOW ALL THE QUARANTINE  INSTRUCTIONS GIVEN IN YOUR HANDOUT.      Your procedure is scheduled on 12-07-18   Report to [place] am A. M.   Call this number if you have problems the morning of surgery  :[phone].   OUR ADDRESS IS [place].  WE ARE LOCATED IN [facility].                                     REMEMBER:  DO NOT EAT FOOD OR DRINK LIQUIDS AFTER MIDNIGHT .  YOU MAY  BRUSH YOUR TEETH MORNING OF SURGERY AND RINSE YOUR MOUTH OUT, NO CHEWING GUM CANDY OR MINTS.   TAKE THESE MEDICATIONS MORNING OF SURGERY WITH A SIP OF WATER:  _____NONE_____________________________  IF YOU ARE SPENDING THE NIGHT AFTER SURGERY PLEASE BRING ALL YOUR PRESCRIPTION MEDICATIONS IN THEIR ORIGINAL BOTTLES. 1 VISITOR IS ALLOWED IN WAITING ROOM ONLY DAY OF SURGERY. NO VISITOR MAY SPEND THE NIGHT. VISITOR ARE ALLOWED TO STAY UNTIL 800 PM.                                    DO NOT WEAR JEWERLY, MAKE UP, OR NAIL POLISH ON FINGERNAILS. DO NOT WEAR LOTIONS, POWDERS, PERFUMES OR DEODORANT. DO NOT SHAVE FOR 24 HOURS PRIOR TO DAY OF SURGERY.  CONTACTS, GLASSES, OR DENTURES MAY NOT BE WORN TO SURGERY.                                    [facility] IS NOT RESPONSIBLE  FOR ANY BELONGINGS.                                                                    Marland Kitchen                                 [facility] - Preparing for Surgery Before surgery, you can play an important role.  Because skin is not sterile, your skin needs to be as free of germs as possible.  You can reduce the number of germs on your skin by washing with CHG (chlorahexidine gluconate) soap before surgery.  CHG is an antiseptic cleaner which kills germs and bonds  with the skin to continue killing germs even after washing. Please DO NOT use if you have an allergy to CHG or antibacterial soaps.  If your skin becomes reddened/irritated stop using the CHG and inform your nurse when you arrive at Short Stay. Do not shave (including legs and underarms) for at least 48 hours prior to the first CHG shower.  You may shave your face/neck.  Please follow these instructions carefully:  1.  Shower with CHG Soap the night before surgery and the  morning of Surgery.  2.  If you choose to wash your hair, wash your hair first as usual with your  normal  shampoo.  3.  After you shampoo, rinse your hair and body thoroughly to remove the  shampoo.                           4.  Use CHG as you would any other liquid soap.  You can apply chg directly  to the skin and wash                       Gently with a scrungie or clean washcloth.  5.  Apply the CHG Soap to your body ONLY FROM THE NECK DOWN.   Do not use on face/ open                           Wound or open sores. Avoid contact with eyes, ears mouth and genitals (private parts).                       Wash face,  Genitals (private parts) with your normal soap.             6.  Wash thoroughly, paying special attention to the area where your surgery  will be performed.  7.  Thoroughly rinse your body with warm water from the neck down.  8.  DO NOT shower/wash with your normal soap after using and rinsing off  the CHG Soap.                9.  Pat yourself dry with a clean towel.            10.  Wear clean pajamas.            11.  Place clean sheets on your bed the night of your first shower and do not  sleep with pets. Day of Surgery : Do not apply any lotions/deodorants the morning of surgery.  Please wear clean clothes to the hospital/surgery center.  FAILURE TO FOLLOW THESE INSTRUCTIONS MAY RESULT IN THE CANCELLATION OF YOUR SURGERY PATIENT SIGNATURE_________________________________  NURSE  SIGNATURE__________________________________  ________________________________________________________________________

## 2018-12-05 ENCOUNTER — Encounter (HOSPITAL_COMMUNITY): Payer: Self-pay

## 2018-12-05 ENCOUNTER — Other Ambulatory Visit: Payer: Self-pay

## 2018-12-05 ENCOUNTER — Encounter (HOSPITAL_COMMUNITY)
Admission: RE | Admit: 2018-12-05 | Discharge: 2018-12-05 | Disposition: A | Discharge status: Home / Self Care | Payer: BC Managed Care – PPO | Source: Ambulatory Visit | Attending: Obstetrics and Gynecology | Admitting: Obstetrics and Gynecology

## 2018-12-05 DIAGNOSIS — Z01812 Encounter for preprocedural laboratory examination: Secondary | ICD-10-CM | POA: Diagnosis present

## 2018-12-05 LAB — CBC
HCT: 35.5 % — ABNORMAL LOW (ref 36.0–46.0)
Hemoglobin: 11.4 g/dL — ABNORMAL LOW (ref 12.0–15.0)
MCH: 30.5 pg (ref 26.0–34.0)
MCHC: 32.1 g/dL (ref 30.0–36.0)
MCV: 94.9 fL (ref 80.0–100.0)
Platelets: 153 10*3/uL (ref 150–400)
RBC: 3.74 MIL/uL — ABNORMAL LOW (ref 3.87–5.11)
RDW: 12 % (ref 11.5–15.5)
WBC: 5.9 10*3/uL (ref 4.0–10.5)
nRBC: 0 % (ref 0.0–0.2)

## 2018-12-05 LAB — NOVEL CORONAVIRUS, NAA (HOSP ORDER, SEND-OUT TO REF LAB; TAT 18-24 HRS): SARS-CoV-2, NAA: NOT DETECTED

## 2018-12-06 NOTE — H&P (Signed)
Allison Lowery is an 48 y.o. female. She has extremely heavy, irregular menses. Endometrial cavity noted to be too narrow to allow Novasure EMA. D&C>benign pathology.  Pertinent Gynecological History: Menses: flow is excessive with use of many pads or tampons on heaviest days Bleeding: dysfunctional uterine bleeding Contraception: bilateral salpingectomy DES exposure: denies Blood transfusions: none Sexually transmitted diseases: no past history Previous GYN Procedures: D&C  Last mammogram: normal Date: 2020 Last pap: normal Date: 2019 OB History: G2, P2   Menstrual History: Menarche age: unknown No LMP recorded. (Menstrual status: Irregular Periods).    Past Medical History:  Diagnosis Date  . CHEST PAIN, PLEURITIC 03/26/2009  . MVP (mitral valve prolapse)    No problem no cardiologist since 48 years old no issues  . Palpitations 10/30/2007    Past Surgical History:  Procedure Laterality Date  . BLADDER SUSPENSION  07/04/2008   DR. SCOTT MACDIARMID  . DILATION AND CURETTAGE OF UTERUS    . IUD REMOVAL N/A 12/28/2013   Procedure: INTRAUTERINE DEVICE (IUD) REMOVAL;  Surgeon: Anastasio Auerbach, MD;  Location: [facility];  Service: Gynecology;  Laterality: N/A;  . LAPAROSCOPIC BILATERAL SALPINGECTOMY Bilateral 12/28/2013   Procedure: LAPAROSCOPIC BILATERAL SALPINGECTOMY ;  Surgeon: Anastasio Auerbach, MD;  Location: [facility];  Service: Gynecology;  Laterality: Bilateral;    Family History  Problem Relation Age of Onset  . Hypertension Mother   . Thyroid disease Mother     Social History:  reports that she quit smoking about 20 years ago. She has never used smokeless tobacco. She reports current alcohol use of about 5.0 standard drinks of alcohol per week. She reports that she does not use drugs.  Allergies:  Allergies  Allergen Reactions  . Percocet [Oxycodone-Acetaminophen] Rash  . Amoxicillin Hives    Did it involve swelling of the face/tongue/throat, SOB, or low BP?  No Did it involve sudden or severe rash/hives, skin peeling, or any reaction on the inside of your mouth or nose? No Did you need to seek medical attention at a hospital or doctor's office? No When did it last happen?childhood allergy If all above answers are "NO", may proceed with cephalosporin use.   . Erythromycin Hives  . Penicillin G Benzathine Hives  . Sulfamethoxazole-Trimethoprim     [name] syndrome  . Tetracycline Hives  . Latex Rash    No medications prior to admission.    Review of Systems  Constitutional: Negative for fever.    There were no vitals taken for this visit. Physical Exam  Cardiovascular: Normal rate and regular rhythm.  Respiratory: Effort normal and breath sounds normal.  GI: Soft.  Genitourinary:    Genitourinary Comments: Uterus 6 weeks size and mobile     No results found for this or any previous visit (from the past 24 hour(s)).  No results found.  Assessment/Plan: 48 yo with menorrhagia for LAVH/BS, removal of ovary if abnormal Risks reviewed including infection, organ damage, bleeding/transfusion-HIV/Hep, DVT/PE, pneumonia, laparotomy, return to OR, pelvic pain/painful intercourse.  Shon Millet II 12/06/2018, 5:09 PM

## 2018-12-06 NOTE — Anesthesia Preprocedure Evaluation (Addendum)
Anesthesia Evaluation  Patient identified by MRN, date of birth, ID band Patient awake    Reviewed: Allergy & Precautions, NPO status , Patient's Chart, lab work & pertinent test results  History of Anesthesia Complications Negative for: history of anesthetic complications  Airway Mallampati: I  TM Distance: >3 FB Neck ROM: Full    Dental no notable dental hx.    Pulmonary former smoker,    Pulmonary exam normal        Cardiovascular negative cardio ROS Normal cardiovascular exam     Neuro/Psych negative neurological ROS  negative psych ROS   GI/Hepatic negative GI ROS, Neg liver ROS,   Endo/Other  negative endocrine ROS  Renal/GU negative Renal ROS  negative genitourinary   Musculoskeletal negative musculoskeletal ROS (+)   Abdominal   Peds  Hematology negative hematology ROS (+)   Anesthesia Other Findings Day of surgery medications reviewed with patient.  Reproductive/Obstetrics negative OB ROS                            Anesthesia Physical Anesthesia Plan  ASA: I  Anesthesia Plan: General   Post-op Pain Management:    Induction: Intravenous  PONV Risk Score and Plan: 4 or greater and Treatment may vary due to age or medical condition, Ondansetron, Dexamethasone, Midazolam and Scopolamine patch - Pre-op  Airway Management Planned: Oral ETT  Additional Equipment: None  Intra-op Plan:   Post-operative Plan: Extubation in OR  Informed Consent: I have reviewed the patients History and Physical, chart, labs and discussed the procedure including the risks, benefits and alternatives for the proposed anesthesia with the patient or authorized representative who has indicated his/her understanding and acceptance.     Dental advisory given  Plan Discussed with: CRNA  Anesthesia Plan Comments:        Anesthesia Quick Evaluation

## 2018-12-07 ENCOUNTER — Observation Stay (HOSPITAL_BASED_OUTPATIENT_CLINIC_OR_DEPARTMENT_OTHER): Payer: BC Managed Care – PPO | Admitting: Physician Assistant

## 2018-12-07 ENCOUNTER — Encounter (HOSPITAL_BASED_OUTPATIENT_CLINIC_OR_DEPARTMENT_OTHER)
Admission: RE | Disposition: A | Discharge status: Home / Self Care | Payer: Self-pay | Source: Home / Self Care | Attending: Obstetrics and Gynecology

## 2018-12-07 ENCOUNTER — Encounter (HOSPITAL_BASED_OUTPATIENT_CLINIC_OR_DEPARTMENT_OTHER): Payer: Self-pay

## 2018-12-07 ENCOUNTER — Observation Stay (HOSPITAL_BASED_OUTPATIENT_CLINIC_OR_DEPARTMENT_OTHER)
Admission: RE | Admit: 2018-12-07 | Discharge: 2018-12-08 | Disposition: A | Discharge status: Home / Self Care | Payer: BC Managed Care – PPO | Attending: Obstetrics and Gynecology | Admitting: Obstetrics and Gynecology

## 2018-12-07 ENCOUNTER — Observation Stay (HOSPITAL_BASED_OUTPATIENT_CLINIC_OR_DEPARTMENT_OTHER): Payer: BC Managed Care – PPO | Admitting: Anesthesiology

## 2018-12-07 DIAGNOSIS — N921 Excessive and frequent menstruation with irregular cycle: Secondary | ICD-10-CM

## 2018-12-07 DIAGNOSIS — Z9079 Acquired absence of other genital organ(s): Secondary | ICD-10-CM | POA: Insufficient documentation

## 2018-12-07 DIAGNOSIS — N800 Endometriosis of uterus: Secondary | ICD-10-CM | POA: Insufficient documentation

## 2018-12-07 DIAGNOSIS — N920 Excessive and frequent menstruation with regular cycle: Secondary | ICD-10-CM | POA: Diagnosis present

## 2018-12-07 DIAGNOSIS — Z87891 Personal history of nicotine dependence: Secondary | ICD-10-CM | POA: Diagnosis not present

## 2018-12-07 DIAGNOSIS — D271 Benign neoplasm of left ovary: Secondary | ICD-10-CM | POA: Insufficient documentation

## 2018-12-07 DIAGNOSIS — N839 Noninflammatory disorder of ovary, fallopian tube and broad ligament, unspecified: Secondary | ICD-10-CM | POA: Insufficient documentation

## 2018-12-07 DIAGNOSIS — N72 Inflammatory disease of cervix uteri: Secondary | ICD-10-CM | POA: Diagnosis not present

## 2018-12-07 HISTORY — PX: LAPAROSCOPIC VAGINAL HYSTERECTOMY WITH SALPINGECTOMY: SHX6680

## 2018-12-07 LAB — TYPE AND SCREEN
ABO/RH(D): O POS
Antibody Screen: NEGATIVE

## 2018-12-07 SURGERY — HYSTERECTOMY, VAGINAL, LAPAROSCOPY-ASSISTED, WITH SALPINGECTOMY
Anesthesia: General | Site: Abdomen | Laterality: Left

## 2018-12-07 MED ORDER — IBUPROFEN 800 MG PO TABS
ORAL_TABLET | ORAL | Status: AC
  Filled 2018-12-07: qty 1

## 2018-12-07 MED ORDER — ROCURONIUM BROMIDE 10 MG/ML (PF) SYRINGE
PREFILLED_SYRINGE | INTRAVENOUS | Status: AC
  Filled 2018-12-07: qty 10

## 2018-12-07 MED ORDER — ACETAMINOPHEN 325 MG PO TABS
ORAL_TABLET | ORAL | Status: AC
  Filled 2018-12-07: qty 2

## 2018-12-07 MED ORDER — SIMETHICONE 80 MG PO CHEW
80.0000 mg | CHEWABLE_TABLET | Freq: Four times a day (QID) | ORAL | Status: DC | PRN
  Filled 2018-12-07: qty 1

## 2018-12-07 MED ORDER — FENTANYL CITRATE (PF) 100 MCG/2ML IJ SOLN
INTRAMUSCULAR | Status: DC | PRN
  Administered 2018-12-07: 25 ug via INTRAVENOUS
  Administered 2018-12-07: 50 ug via INTRAVENOUS
  Administered 2018-12-07 (×2): 25 ug via INTRAVENOUS
  Administered 2018-12-07: 50 ug via INTRAVENOUS
  Administered 2018-12-07: 25 ug via INTRAVENOUS

## 2018-12-07 MED ORDER — CLINDAMYCIN PHOSPHATE 900 MG/50ML IV SOLN
900.0000 mg | INTRAVENOUS | Status: AC
  Administered 2018-12-07: 900 mg via INTRAVENOUS
  Filled 2018-12-07: qty 50

## 2018-12-07 MED ORDER — OXYCODONE HCL 5 MG PO TABS
5.0000 mg | ORAL_TABLET | Freq: Once | ORAL | Status: DC | PRN
  Filled 2018-12-07: qty 1

## 2018-12-07 MED ORDER — PROMETHAZINE HCL 25 MG/ML IJ SOLN
6.2500 mg | INTRAMUSCULAR | Status: DC | PRN
  Filled 2018-12-07: qty 1

## 2018-12-07 MED ORDER — MIDAZOLAM HCL 5 MG/5ML IJ SOLN
INTRAMUSCULAR | Status: DC | PRN
  Administered 2018-12-07 (×2): 2 mg via INTRAVENOUS

## 2018-12-07 MED ORDER — OXYCODONE HCL 5 MG PO TABS
ORAL_TABLET | ORAL | Status: AC
  Filled 2018-12-07: qty 2

## 2018-12-07 MED ORDER — ACETAMINOPHEN 500 MG PO TABS
1000.0000 mg | ORAL_TABLET | Freq: Once | ORAL | Status: AC
  Administered 2018-12-07: 06:00:00 1000 mg via ORAL
  Filled 2018-12-07: qty 2

## 2018-12-07 MED ORDER — FENTANYL CITRATE (PF) 100 MCG/2ML IJ SOLN
25.0000 ug | INTRAMUSCULAR | Status: DC | PRN
  Filled 2018-12-07: qty 1

## 2018-12-07 MED ORDER — ACETAMINOPHEN 500 MG PO TABS
ORAL_TABLET | ORAL | Status: AC
  Filled 2018-12-07: qty 2

## 2018-12-07 MED ORDER — MIDAZOLAM HCL 2 MG/2ML IJ SOLN
INTRAMUSCULAR | Status: AC
  Filled 2018-12-07: qty 2

## 2018-12-07 MED ORDER — ONDANSETRON HCL 4 MG/2ML IJ SOLN
4.0000 mg | Freq: Four times a day (QID) | INTRAMUSCULAR | Status: DC | PRN
  Filled 2018-12-07: qty 2

## 2018-12-07 MED ORDER — FENTANYL CITRATE (PF) 100 MCG/2ML IJ SOLN
INTRAMUSCULAR | Status: AC
  Filled 2018-12-07: qty 2

## 2018-12-07 MED ORDER — ACETAMINOPHEN 325 MG PO TABS
650.0000 mg | ORAL_TABLET | ORAL | Status: DC | PRN
  Administered 2018-12-07 – 2018-12-08 (×4): 650 mg via ORAL
  Filled 2018-12-07: qty 2

## 2018-12-07 MED ORDER — SODIUM CHLORIDE 0.9 % IR SOLN
Status: DC | PRN
  Administered 2018-12-07: 3000 mL

## 2018-12-07 MED ORDER — SENNA 8.6 MG PO TABS
ORAL_TABLET | ORAL | Status: AC
  Filled 2018-12-07: qty 1

## 2018-12-07 MED ORDER — IBUPROFEN 800 MG PO TABS
800.0000 mg | ORAL_TABLET | Freq: Three times a day (TID) | ORAL | Status: DC
  Filled 2018-12-07: qty 1

## 2018-12-07 MED ORDER — KETOROLAC TROMETHAMINE 30 MG/ML IJ SOLN
30.0000 mg | Freq: Once | INTRAMUSCULAR | Status: AC | PRN
  Administered 2018-12-07: 30 mg via INTRAVENOUS
  Filled 2018-12-07: qty 1

## 2018-12-07 MED ORDER — ONDANSETRON HCL 4 MG PO TABS
4.0000 mg | ORAL_TABLET | Freq: Four times a day (QID) | ORAL | Status: DC | PRN
  Filled 2018-12-07: qty 1

## 2018-12-07 MED ORDER — GENTAMICIN SULFATE 40 MG/ML IJ SOLN
310.0000 mg | INTRAVENOUS | Status: AC
  Administered 2018-12-07: 310 mg via INTRAVENOUS
  Filled 2018-12-07: qty 7.75

## 2018-12-07 MED ORDER — EPHEDRINE SULFATE 50 MG/ML IJ SOLN
INTRAMUSCULAR | Status: DC | PRN
  Administered 2018-12-07: 7.5 mg via INTRAVENOUS

## 2018-12-07 MED ORDER — LIDOCAINE 2% (20 MG/ML) 5 ML SYRINGE
INTRAMUSCULAR | Status: AC
  Filled 2018-12-07: qty 5

## 2018-12-07 MED ORDER — OXYCODONE HCL 5 MG PO TABS
ORAL_TABLET | ORAL | Status: AC
  Filled 2018-12-07: qty 1

## 2018-12-07 MED ORDER — MENTHOL 3 MG MT LOZG
1.0000 | LOZENGE | OROMUCOSAL | Status: DC | PRN
  Filled 2018-12-07: qty 9

## 2018-12-07 MED ORDER — LACTATED RINGERS IV SOLN
INTRAVENOUS | Status: DC
  Administered 2018-12-07 (×2): via INTRAVENOUS
  Filled 2018-12-07: qty 1000

## 2018-12-07 MED ORDER — ONDANSETRON HCL 4 MG/2ML IJ SOLN
INTRAMUSCULAR | Status: AC
  Filled 2018-12-07: qty 2

## 2018-12-07 MED ORDER — METHOCARBAMOL 500 MG PO TABS
500.0000 mg | ORAL_TABLET | Freq: Four times a day (QID) | ORAL | Status: DC | PRN
  Administered 2018-12-07: 22:00:00 500 mg via ORAL
  Filled 2018-12-07: qty 1

## 2018-12-07 MED ORDER — EPHEDRINE 5 MG/ML INJ
INTRAVENOUS | Status: AC
  Filled 2018-12-07: qty 10

## 2018-12-07 MED ORDER — DEXAMETHASONE SODIUM PHOSPHATE 4 MG/ML IJ SOLN
INTRAMUSCULAR | Status: DC | PRN
  Administered 2018-12-07: 10 mg via INTRAVENOUS

## 2018-12-07 MED ORDER — BUPIVACAINE HCL (PF) 0.5 % IJ SOLN
INTRAMUSCULAR | Status: DC | PRN
  Administered 2018-12-07: 7 mL
  Administered 2018-12-07: 23 mL

## 2018-12-07 MED ORDER — OXYCODONE HCL 5 MG PO TABS
5.0000 mg | ORAL_TABLET | ORAL | Status: DC | PRN
  Administered 2018-12-07: 5 mg via ORAL
  Administered 2018-12-07 (×2): 10 mg via ORAL
  Administered 2018-12-07 – 2018-12-08 (×3): 5 mg via ORAL
  Filled 2018-12-07: qty 2

## 2018-12-07 MED ORDER — DEXAMETHASONE SODIUM PHOSPHATE 10 MG/ML IJ SOLN
INTRAMUSCULAR | Status: AC
  Filled 2018-12-07: qty 1

## 2018-12-07 MED ORDER — ONDANSETRON HCL 4 MG/2ML IJ SOLN
INTRAMUSCULAR | Status: DC | PRN
  Administered 2018-12-07: 4 mg via INTRAVENOUS

## 2018-12-07 MED ORDER — SCOPOLAMINE 1 MG/3DAYS TD PT72
1.0000 | MEDICATED_PATCH | Freq: Once | TRANSDERMAL | Status: DC
  Administered 2018-12-07: 1.5 mg via TRANSDERMAL
  Filled 2018-12-07: qty 1

## 2018-12-07 MED ORDER — SOD CITRATE-CITRIC ACID 500-334 MG/5ML PO SOLN
30.0000 mL | ORAL | Status: DC
  Filled 2018-12-07: qty 30

## 2018-12-07 MED ORDER — HYDROMORPHONE HCL 1 MG/ML IJ SOLN
0.2000 mg | INTRAMUSCULAR | Status: DC | PRN
  Filled 2018-12-07: qty 1

## 2018-12-07 MED ORDER — LACTATED RINGERS IV SOLN
INTRAVENOUS | Status: DC
  Administered 2018-12-07: 07:00:00 via INTRAVENOUS
  Filled 2018-12-07: qty 1000

## 2018-12-07 MED ORDER — ROCURONIUM BROMIDE 100 MG/10ML IV SOLN
INTRAVENOUS | Status: DC | PRN
  Administered 2018-12-07: 60 mg via INTRAVENOUS
  Administered 2018-12-07: 10 mg via INTRAVENOUS

## 2018-12-07 MED ORDER — FENTANYL CITRATE (PF) 100 MCG/2ML IJ SOLN
25.0000 ug | INTRAMUSCULAR | Status: DC | PRN
  Administered 2018-12-07: 25 ug via INTRAVENOUS
  Filled 2018-12-07: qty 1

## 2018-12-07 MED ORDER — PROPOFOL 10 MG/ML IV BOLUS
INTRAVENOUS | Status: AC
  Filled 2018-12-07: qty 40

## 2018-12-07 MED ORDER — SUGAMMADEX SODIUM 200 MG/2ML IV SOLN
INTRAVENOUS | Status: DC | PRN
  Administered 2018-12-07: 200 mg via INTRAVENOUS
  Administered 2018-12-07: 25 mg via INTRAVENOUS

## 2018-12-07 MED ORDER — LACTATED RINGERS IV SOLN
INTRAVENOUS | Status: DC
  Administered 2018-12-07: 11:00:00 via INTRAVENOUS
  Filled 2018-12-07 (×2): qty 1000

## 2018-12-07 MED ORDER — PROPOFOL 10 MG/ML IV BOLUS
INTRAVENOUS | Status: DC | PRN
  Administered 2018-12-07: 200 mg via INTRAVENOUS

## 2018-12-07 MED ORDER — SCOPOLAMINE 1 MG/3DAYS TD PT72
MEDICATED_PATCH | TRANSDERMAL | Status: AC
  Filled 2018-12-07: qty 1

## 2018-12-07 MED ORDER — OXYCODONE HCL 5 MG/5ML PO SOLN
5.0000 mg | Freq: Once | ORAL | Status: DC | PRN
  Filled 2018-12-07: qty 5

## 2018-12-07 MED ORDER — LIDOCAINE HCL (CARDIAC) PF 100 MG/5ML IV SOSY
PREFILLED_SYRINGE | INTRAVENOUS | Status: DC | PRN
  Administered 2018-12-07: 100 mg via INTRAVENOUS

## 2018-12-07 MED ORDER — CLINDAMYCIN PHOSPHATE 900 MG/50ML IV SOLN
INTRAVENOUS | Status: AC
  Filled 2018-12-07: qty 50

## 2018-12-07 MED ORDER — SENNA 8.6 MG PO TABS
1.0000 | ORAL_TABLET | Freq: Two times a day (BID) | ORAL | Status: DC
  Administered 2018-12-07 (×2): 8.6 mg via ORAL
  Filled 2018-12-07: qty 1

## 2018-12-07 MED ORDER — METHOCARBAMOL 500 MG PO TABS
ORAL_TABLET | ORAL | Status: AC
  Filled 2018-12-07: qty 1

## 2018-12-07 SURGICAL SUPPLY — 51 items
BLADE CLIPPER SENSICLIP SURGIC (BLADE) IMPLANT
CANISTER SUCT 3000ML PPV (MISCELLANEOUS) ×6 IMPLANT
CATH ROBINSON RED A/P 16FR (CATHETERS) ×3 IMPLANT
CHLORAPREP W/TINT 26 (MISCELLANEOUS) ×3 IMPLANT
COVER BACK TABLE 60X90IN (DRAPES) ×3 IMPLANT
COVER MAYO STAND STRL (DRAPES) ×6 IMPLANT
COVER WAND RF STERILE (DRAPES) ×3 IMPLANT
DECANTER SPIKE VIAL GLASS SM (MISCELLANEOUS) ×3 IMPLANT
DERMABOND ADVANCED (GAUZE/BANDAGES/DRESSINGS) ×2
DERMABOND ADVANCED .7 DNX12 (GAUZE/BANDAGES/DRESSINGS) ×1 IMPLANT
DRSG OPSITE POSTOP 3X4 (GAUZE/BANDAGES/DRESSINGS) IMPLANT
ELECT REM PT RETURN 9FT ADLT (ELECTROSURGICAL) ×3
ELECTRODE REM PT RTRN 9FT ADLT (ELECTROSURGICAL) ×1 IMPLANT
GAUZE 4X4 16PLY RFD (DISPOSABLE) ×3 IMPLANT
GLOVE BIO SURGEON STRL SZ8 (GLOVE) ×9 IMPLANT
GLOVE ECLIPSE 6.5 STRL STRAW (GLOVE) ×3 IMPLANT
GOWN STRL REUS W/TWL XL LVL3 (GOWN DISPOSABLE) ×6 IMPLANT
HOLDER FOLEY CATH W/STRAP (MISCELLANEOUS) ×3 IMPLANT
KIT TURNOVER CYSTO (KITS) ×3 IMPLANT
LIGASURE IMPACT 36 18CM CVD LR (INSTRUMENTS) ×3 IMPLANT
NEEDLE INSUFFLATION 120MM (ENDOMECHANICALS) ×3 IMPLANT
NEEDLE INSUFFLATION 14GA 150MM (NEEDLE) IMPLANT
NS IRRIG 500ML POUR BTL (IV SOLUTION) ×3 IMPLANT
PACK LAVH (CUSTOM PROCEDURE TRAY) ×3 IMPLANT
PACK TRENDGUARD 450 HYBRID PRO (MISCELLANEOUS) ×1 IMPLANT
PAD OB MATERNITY 4.3X12.25 (PERSONAL CARE ITEMS) ×3 IMPLANT
PAD PREP 24X48 CUFFED NSTRL (MISCELLANEOUS) ×3 IMPLANT
SCISSORS LAP 5X35 DISP (ENDOMECHANICALS) IMPLANT
SCISSORS LAP 5X45 EPIX DISP (ENDOMECHANICALS) IMPLANT
SEALER TISSUE G2 CVD JAW 45CM (ENDOMECHANICALS) ×3 IMPLANT
SET IRRIG TUBING LAPAROSCOPIC (IRRIGATION / IRRIGATOR) ×3 IMPLANT
SET TUBE SMOKE EVAC HIGH FLOW (TUBING) ×3 IMPLANT
SOLUTION ELECTROLUBE (MISCELLANEOUS) IMPLANT
SUT MNCRL 0 MO-4 VIOLET 18 CR (SUTURE) ×2 IMPLANT
SUT MNCRL 0 VIOLET 6X18 (SUTURE) ×1 IMPLANT
SUT MNCRL AB 0 CT1 27 (SUTURE) IMPLANT
SUT MON AB-0 CT1 36 (SUTURE) IMPLANT
SUT MONOCRYL 0 6X18 (SUTURE) ×2
SUT MONOCRYL 0 MO 4 18  CR/8 (SUTURE) ×4
SUT VIC AB 4-0 SH 27 (SUTURE) ×2
SUT VIC AB 4-0 SH 27XANBCTRL (SUTURE) ×1 IMPLANT
SUT VICRYL 0 UR6 27IN ABS (SUTURE) ×3 IMPLANT
SUT VICRYL 4-0 PS2 18IN ABS (SUTURE) ×3 IMPLANT
SYR 30ML LL (SYRINGE) ×3 IMPLANT
SYR BULB IRRIGATION 50ML (SYRINGE) IMPLANT
TOWEL OR 17X26 10 PK STRL BLUE (TOWEL DISPOSABLE) ×3 IMPLANT
TRAY FOLEY W/BAG SLVR 14FR (SET/KITS/TRAYS/PACK) ×3 IMPLANT
TRENDGUARD 450 HYBRID PRO PACK (MISCELLANEOUS) ×3
TROCAR XCEL NON-BLD 11X100MML (ENDOMECHANICALS) ×3 IMPLANT
TROCAR XCEL NON-BLD 5MMX100MML (ENDOMECHANICALS) ×3 IMPLANT
WARMER LAPAROSCOPE (MISCELLANEOUS) ×3 IMPLANT

## 2018-12-07 NOTE — Progress Notes (Signed)
Tolerating liquids and toast Good pain relief  Today's Vitals   12/07/18 1100 12/07/18 1115 12/07/18 1148 12/07/18 1557  BP: 120/69  115/69 115/60  Pulse: 72  63 (!) 59  Resp: 18  16 18   Temp:   98.2 F (36.8 C)   TempSrc:      SpO2: 99%  99% 99%  Weight:      Height:      PainSc:  4  2     Body mass index is 24.32 kg/m.  UO clear Abdomen soft  A/P: Stable         IV to saline lock         D/C foley         D/C ibuprofen         Robaxin 539m q6hrs prn

## 2018-12-07 NOTE — Transfer of Care (Signed)
Immediate Anesthesia Transfer of Care Note  Patient: Allison Lowery  Procedure(s) Performed: Procedure(s) (LRB): LAPAROSCOPIC ASSISTED VAGINAL HYSTERECTOMY LEFT OOPHARECTOMY (Left)  Patient Location: PACU  Anesthesia Type: General  Level of Consciousness: awake, sedated, patient cooperative and responds to stimulation  Airway & Oxygen Therapy: Patient Spontanous Breathing and Patient connected to [place] 02 and soft FM  Post-op Assessment: Report given to PACU RN, Post -op Vital signs reviewed and stable and Patient moving all extremities  Post vital signs: Reviewed and stable  Complications: No apparent anesthesia complications

## 2018-12-07 NOTE — Anesthesia Postprocedure Evaluation (Signed)
Anesthesia Post Note  Patient: Allison Lowery  Procedure(s) Performed: LAPAROSCOPIC ASSISTED VAGINAL HYSTERECTOMY LEFT OOPHARECTOMY (Left Abdomen)     Patient location during evaluation: PACU Anesthesia Type: General Level of consciousness: awake and alert and oriented Pain management: pain level controlled Vital Signs Assessment: post-procedure vital signs reviewed and stable Respiratory status: spontaneous breathing, nonlabored ventilation and respiratory function stable Cardiovascular status: blood pressure returned to baseline Postop Assessment: no apparent nausea or vomiting Anesthetic complications: no    Last Vitals:  Vitals:   12/07/18 1000 12/07/18 1033  BP: 125/77 128/78  Pulse: 79 71  Resp: 12 18  Temp:  37 C  SpO2: 100% 100%    Last Pain:  Vitals:   12/07/18 1033  TempSrc:   PainSc: [place]

## 2018-12-07 NOTE — Progress Notes (Signed)
12/07/2018  9:19 AM  PATIENT:  Allison Lowery  49 y.o. female  PRE-OPERATIVE DIAGNOSIS:  MENORRHAGIA  POST-OPERATIVE DIAGNOSIS:  MENORRHAGIA  PROCEDURE:  Procedure(s) with comments: LAPAROSCOPIC ASSISTED VAGINAL HYSTERECTOMY LEFT OOPHARECTOMY (Left) - NEED BED  SURGEON:  Surgeon(s) and Role:    * Everlene Farrier, MD - Primary    * Morris, Jinny Blossom, DO - Assisting  PHYSICIAN ASSISTANT:   ASSISTANTS: Megan Morris DO   ANESTHESIA:   general  EBL:  400 mL   BLOOD ADMINISTERED:none  DRAINS: Urinary Catheter (Foley)   LOCAL MEDICATIONS USED:  MARCAINE    and Amount: 30 ml  SPECIMEN:  Source of Specimen:  uterus  DISPOSITION OF SPECIMEN:  PATHOLOGY  COUNTS:  YES  TOURNIQUET:  * No tourniquets in log *  DICTATION: .Other Dictation: Dictation Number [id number]  PLAN OF CARE: Admit for overnight observation  PATIENT DISPOSITION:  PACU - hemodynamically stable.   Delay start of Pharmacological VTE agent (>24hrs) due to surgical blood loss or risk of bleeding: not applicable

## 2018-12-07 NOTE — Anesthesia Procedure Notes (Signed)
Procedure Name: Intubation Date/Time: 12/07/2018 7:51 AM Performed by: Justice Rocher, CRNA Pre-anesthesia Checklist: Patient identified, Emergency Drugs available, Suction available and Patient being monitored Patient Re-evaluated:Patient Re-evaluated prior to induction Oxygen Delivery Method: Circle system utilized Preoxygenation: Pre-oxygenation with 100% oxygen Induction Type: IV induction Ventilation: Mask ventilation without difficulty Laryngoscope Size: Mac and 3 Grade View: Grade I Tube type: Oral Tube size: 7.0 mm Number of attempts: 1 Airway Equipment and Method: Stylet and Oral airway Placement Confirmation: ETT inserted through vocal cords under direct vision,  positive ETCO2 and breath sounds checked- equal and bilateral Secured at: 22 cm Tube secured with: Tape Dental Injury: Teeth and Oropharynx as per pre-operative assessment

## 2018-12-07 NOTE — Progress Notes (Signed)
No changes to H&P per patient history Reviewed procedure-LAVH (S/P BS) All questions answered Patient states she understands and agrees

## 2018-12-08 DIAGNOSIS — N920 Excessive and frequent menstruation with regular cycle: Secondary | ICD-10-CM | POA: Diagnosis not present

## 2018-12-08 LAB — CBC
HCT: 27.6 % — ABNORMAL LOW (ref 36.0–46.0)
Hemoglobin: 9 g/dL — ABNORMAL LOW (ref 12.0–15.0)
MCH: 30.5 pg (ref 26.0–34.0)
MCHC: 32.6 g/dL (ref 30.0–36.0)
MCV: 93.6 fL (ref 80.0–100.0)
Platelets: 115 10*3/uL — ABNORMAL LOW (ref 150–400)
RBC: 2.95 MIL/uL — ABNORMAL LOW (ref 3.87–5.11)
RDW: 11.9 % (ref 11.5–15.5)
WBC: 8 10*3/uL (ref 4.0–10.5)
nRBC: 0 % (ref 0.0–0.2)

## 2018-12-08 LAB — SURGICAL PATHOLOGY

## 2018-12-08 MED ORDER — OXYCODONE HCL 5 MG PO TABS
ORAL_TABLET | ORAL | Status: AC
  Filled 2018-12-08: qty 1

## 2018-12-08 MED ORDER — OXYCODONE HCL 5 MG PO TABS: 5.0000 mg | ORAL_TABLET | Freq: Four times a day (QID) | ORAL | 0 refills | Status: DC | PRN

## 2018-12-08 MED ORDER — ACETAMINOPHEN 325 MG PO TABS
ORAL_TABLET | ORAL | Status: AC
  Filled 2018-12-08: qty 2

## 2018-12-08 MED ORDER — METHOCARBAMOL 500 MG PO TABS: 500.0000 mg | ORAL_TABLET | Freq: Four times a day (QID) | ORAL | 0 refills | Status: DC | PRN

## 2018-12-08 MED ORDER — ACETAMINOPHEN 325 MG PO TABS: 650.0000 mg | ORAL_TABLET | Freq: Four times a day (QID) | ORAL | 0 refills | Status: DC | PRN

## 2018-12-08 NOTE — Op Note (Signed)
NAME: Allison Lowery, Allison Lowery MEDICAL RECORD [id number] ACCOUNT [id number] DATE OF BIRTH:Mar 31, 1970 FACILITY: [facility] LOCATION: [facility] PHYSICIAN:Ella Guillotte E. Alexandr Oehler II, MD  OPERATIVE REPORT  DATE OF PROCEDURE:  12/07/2018  PREOPERATIVE DIAGNOSIS:  Menorrhagia.    POSTOPERATIVE DIAGNOSES:   1.  Menorrhagia. 2.  Left ovarian cyst.  PROCEDURE:  Laparoscopically-assisted vaginal hysterectomy with left oophorectomy.  SURGEON:  Everlene Farrier, MD  ASSISTANT:  Linda Hedges, DO.  ANESTHESIA:  General with endotracheal intubation.  ESTIMATED BLOOD LOSS:  400 mL.  SPECIMENS:  Uterus and left ovary to pathology.  INDICATIONS AND CONSENT:  This patient is a 48 year old patient status post bilateral salpingectomy with debilitating heavy menses.  Hysteroscopy and D and C noted benign pathology with an extremely narrow endometrial cavity that precluded safe  endometrial ablation.  Laparoscopically-assisted vaginal hysterectomy and removal of an ovary if abnormal has been discussed preoperatively.  Potential risks and complications have been discussed with the patient preoperatively including but not limited  to infection, organ damage, bleeding requiring transfusion of blood products with HIV and hepatitis acquisition, DVT, PE, pneumonia, fistula formation laparotomy, pelvic pain, painful intercourse, and return to the operating room.  The patient states she  understands and agrees and consent was signed on the chart.  FINDINGS:  Upper abdomen was grossly normal.  In the pelvis, uterus is retroverted about 8 weeks in size.  Fallopian tubes are surgically absent bilaterally.  Right ovary is normal.  Left ovary has a 3 cm smooth translucent cyst.  DESCRIPTION OF PROCEDURE:  The patient was taken to the operating room where she was identified, placed in the dorsal supine position and general anesthesia was induced via endotracheal intubation.  She was then placed in the dorsal lithotomy position.    Timeout was undertaken.  She is prepped abdominally with ChloraPrep and vaginally with Hibiclens.  Bladder straight catheterized.  Hulka tenaculum was placed in the uterus as a manipulator and after a 3 minute drying time, she was draped in sterile  fashion.  The infraumbilical and suprapubic areas were injected with 7 mL of 0.5% plain Marcaine.  A small infraumbilical incision was made and a disposable Veress needle was placed on the first attempt without difficulty.  Good syringe and drop test was  noted.  Two liters of gas were then insufflated under low pressure with good tympany in the right upper quadrant.  Veress needle was removed and a 10-11 XL bladeless disposable trocar sleeve was placed under direct visualization with the diagnostic  laparoscope.  After placement, the operative scope was used.  A small suprapubic incision was made in the midline and a 5 mm disposable trocar sleeve was placed under direct visualization without difficulty.  The above findings were noted.  Then, using  the EnSeal bipolar cautery cutting instrument the left infundibulopelvic ligament was taken down coming across the meso across the round ligament down to the level of vesicouterine peritoneum.  On the right side, proximal ligaments were taken down to the  level of vesicouterine peritoneum.  Vesicouterine peritoneum was then taken down cephalad laterally.  Suprapubic trocar sleeve and instruments were removed and attention was turned to the vagina.  Posterior cul-de-sac was entered sharply and the cervix  was circumscribed with unipolar cautery.  Mucosa was advanced sharply and bluntly.  Then, using the handheld LigaSure bipolar cutter cutting instrument, the uterosacral ligaments followed by the bladder pillars cardinal ligaments and uterine vessels were  taken bilaterally.  Anterior cul-de-sac was entered as well.  Specimen was then delivered  posteriorly and the uterus and the left ovary are removed.  All sutures  will be 0 Monocryl unless otherwise designated.  Uterosacral ligaments then plicated the  cuff bilaterally and then plicated in the midline with a third suture.  Cuff was closed with figure-of-eights.  Foley catheter was placed and clear urine is noted.  Attention was returned to the abdomen.  Pneumoperitoneum was reintroduced.  The  suprapubic trocar sleeve was replaced under direct visualization.  Lavage was carried out and minor oozing was controlled at peritoneal edges with bipolar cautery.  Careful inspection under reduced pneumoperitoneum reveals good continued hemostasis.  The  remaining approximately 23 mL of 0.5% plain Marcaine was then instilled into the peritoneal cavity.  Suprapubic trocar sleeve was removed.  Pneumoperitoneum was reduced and the umbilical trocar sleeve was removed.  Umbilical incision was closed with 0  Vicryl in the subcutaneous layers under good visualization.  The skin on both incisions was closed with a 2-0 Vicryl.  Dermabond was placed on both.  All counts were correct and the patient was taken to the recovery room in stable condition.  TN/NUANCE  D:12/07/2018 T:12/07/2018 JOB:[id number]

## 2018-12-08 NOTE — Discharge Summary (Signed)
Physician Discharge Summary  Patient ID: Allison Lowery MRN: [id number] DOB/AGE: 04-01-1970 48 y.o.  Admit date: 12/07/2018 Discharge date: 12/08/2018  Admission Diagnoses:menorrhagia  Discharge Diagnoses:  Active Problems:   Menorrhagia   Discharged Condition: good  Hospital Course: Had good resumption of bowel/bladder function. Good pain control  Consults: None  Significant Diagnostic Studies: labs:  Results for orders placed or performed during the hospital encounter of 12/07/18 (from the past 24 hour(s))  CBC     Status: Abnormal   Collection Time: 12/08/18  4:49 AM  Result Value Ref Range   WBC 8.0 4.0 - 10.5 K/uL   RBC 2.95 (L) 3.87 - 5.11 MIL/uL   Hemoglobin 9.0 (L) 12.0 - 15.0 g/dL   HCT 27.6 (L) 36.0 - 46.0 %   MCV 93.6 80.0 - 100.0 fL   MCH 30.5 26.0 - 34.0 pg   MCHC 32.6 30.0 - 36.0 g/dL   RDW 11.9 11.5 - 15.5 %   Platelets 115 (L) 150 - 400 K/uL   nRBC 0.0 0.0 - 0.2 %    Treatments: surgery: LAVH  Discharge Exam: Blood pressure 102/67, pulse (!) 59, temperature 98.2 F (36.8 C), resp. rate 16, height 5' 3"  (1.6 m), weight 62.3 kg, SpO2 100 %. General appearance: alert, cooperative and no distress GI: soft, non-tender; bowel sounds normal; no masses,  no organomegaly  Disposition: Discharge disposition: 01-Home or Self Care       Discharge Instructions    Discharge patient   Complete by: As directed    Discharge disposition: 01-Home or Self Care   Discharge patient date: 12/08/2018     Allergies as of 12/08/2018      Reactions   Amoxicillin Hives   Did it involve swelling of the face/tongue/throat, SOB, or low BP? No Did it involve sudden or severe rash/hives, skin peeling, or any reaction on the inside of your mouth or nose? No Did you need to seek medical attention at a hospital or doctor's office? No When did it last happen?childhood allergy If all above answers are "NO", may proceed with cephalosporin use.   Erythromycin Hives    Penicillin G Benzathine Hives   Sulfamethoxazole-trimethoprim    [name] syndrome   Tetracycline Hives   Latex Rash      Medication List    STOP taking these medications   megestrol 20 MG tablet Commonly known as: MEGACE   Melatonin 5 MG Caps     TAKE these medications   acetaminophen 325 MG tablet Commonly known as: TYLENOL Take 2 tablets (650 mg total) by mouth every 6 (six) hours as needed for mild pain (temperature > 101.5.). What changed:   medication strength  how much to take  reasons to take this   methocarbamol 500 MG tablet Commonly known as: ROBAXIN Take 1 tablet (500 mg total) by mouth every 6 (six) hours as needed for muscle spasms.   oxyCODONE 5 MG immediate release tablet Commonly known as: Oxy IR/ROXICODONE Take 1 tablet (5 mg total) by mouth every 6 (six) hours as needed for moderate pain.        Signed: Shon Millet II 12/08/2018, 8:03 AM

## 2018-12-11 ENCOUNTER — Encounter (HOSPITAL_BASED_OUTPATIENT_CLINIC_OR_DEPARTMENT_OTHER): Payer: Self-pay | Admitting: Obstetrics and Gynecology

## 2019-11-01 LAB — LIPID PANEL
Cholesterol: 205 — AB (ref 0–200)
HDL: 84 — AB (ref 35–70)
LDL Cholesterol: 111
Triglycerides: 52 (ref 40–160)

## 2020-06-04 ENCOUNTER — Other Ambulatory Visit: Payer: Self-pay

## 2020-06-05 ENCOUNTER — Ambulatory Visit: Payer: PRIVATE HEALTH INSURANCE | Admitting: Internal Medicine

## 2020-06-05 ENCOUNTER — Other Ambulatory Visit: Payer: Self-pay

## 2020-06-05 ENCOUNTER — Encounter: Payer: Self-pay | Admitting: Internal Medicine

## 2020-06-05 VITALS — BP 120/80 | HR 73 | Temp 98.3°F | Resp 16 | Ht 63.0 in | Wt 132.0 lb

## 2020-06-05 DIAGNOSIS — E559 Vitamin D deficiency, unspecified: Secondary | ICD-10-CM | POA: Diagnosis not present

## 2020-06-05 DIAGNOSIS — Z1211 Encounter for screening for malignant neoplasm of colon: Secondary | ICD-10-CM

## 2020-06-05 DIAGNOSIS — Z0001 Encounter for general adult medical examination with abnormal findings: Secondary | ICD-10-CM | POA: Diagnosis not present

## 2020-06-05 DIAGNOSIS — R002 Palpitations: Secondary | ICD-10-CM | POA: Diagnosis not present

## 2020-06-05 LAB — BASIC METABOLIC PANEL
BUN: 12 mg/dL (ref 6–23)
CO2: 31 mEq/L (ref 19–32)
Calcium: 9.9 mg/dL (ref 8.4–10.5)
Chloride: 102 mEq/L (ref 96–112)
Creatinine, Ser: 0.86 mg/dL (ref 0.40–1.20)
GFR: 80.14 mL/min (ref >60.00)
Glucose, Bld: 103 mg/dL — ABNORMAL HIGH (ref 70–99)
Potassium: 4.2 mEq/L (ref 3.5–5.1)
Sodium: 139 mEq/L (ref 135–145)

## 2020-06-05 LAB — CBC WITH DIFFERENTIAL/PLATELET
Basophils Absolute: 0 10*3/uL (ref 0.0–0.1)
Basophils Relative: 0.5 % (ref 0.0–3.0)
Eosinophils Absolute: 0 10*3/uL (ref 0.0–0.7)
Eosinophils Relative: 0.6 % (ref 0.0–5.0)
HCT: 37.2 % (ref 36.0–46.0)
Hemoglobin: 12.6 g/dL (ref 12.0–15.0)
Lymphocytes Relative: 30.5 % (ref 12.0–46.0)
Lymphs Abs: 1.5 10*3/uL (ref 0.7–4.0)
MCHC: 33.9 g/dL (ref 30.0–36.0)
MCV: 88.9 fl (ref 78.0–100.0)
Monocytes Absolute: 0.5 10*3/uL (ref 0.1–1.0)
Monocytes Relative: 9.7 % (ref 3.0–12.0)
Neutro Abs: 2.8 10*3/uL (ref 1.4–7.7)
Neutrophils Relative %: 58.7 % (ref 43.0–77.0)
Platelets: 186 10*3/uL (ref 150.0–400.0)
RBC: 4.18 Mil/uL (ref 3.87–5.11)
RDW: 12.3 % (ref 11.5–15.5)
WBC: 4.8 10*3/uL (ref 4.0–10.5)

## 2020-06-05 LAB — VITAMIN D 25 HYDROXY (VIT D DEFICIENCY, FRACTURES): VITD: 51.24 ng/mL (ref 30.00–100.00)

## 2020-06-05 LAB — T4, FREE: Free T4: 0.86 ng/dL (ref 0.60–1.60)

## 2020-06-05 LAB — TSH: TSH: 0.78 u[IU]/mL (ref 0.35–4.50)

## 2020-06-05 LAB — MAGNESIUM: Magnesium: 1.7 mg/dL (ref 1.5–2.5)

## 2020-06-05 NOTE — Progress Notes (Signed)
Subjective:  Patient ID: Allison Lowery, female    DOB: 08/27/1970  Age: 50 y.o. MRN: [id number]  CC: Annual Exam and Palpitations  This visit occurred during the SARS-CoV-2 public health emergency.  Safety protocols were in place, including screening questions prior to the visit, additional usage of staff PPE, and extensive cleaning of exam room while observing appropriate contact time as indicated for disinfecting solutions.    HPI Allison Lowery presents for a CPX, f/up, and to establish.  She has a history of mitral valve prolapse and has had benign palpitations for years but for the last 6 months her symptoms have changed.  She complains of intermittent episodes of an elevated heart rate.  She thinks this happens about once a week.  She was recently running and her heart rate was so high that she had a hard time catching her breath and had to stop running.  She did not experience chest pain, dizziness, lightheadedness, near-syncope, or syncope.  She also notices the palpitations when she is in the bed.  She says they usually last for about 30 seconds but she had an episode recently that lasted longer than that.  She does not take anything that would elevate her heart rate like decongestant.  She drinks wine on the weekends and says this does not exacerbate her symptoms.  She runs about 3 miles a day.  History [name] has a past medical history of CHEST PAIN, PLEURITIC (03/26/2009), MVP (mitral valve prolapse), and Palpitations (10/30/2007).   She has a past surgical history that includes Dilation and curettage of uterus; Bladder suspension (07/04/2008); Laparoscopic bilateral salpingectomy (Bilateral, 12/28/2013); IUD removal (N/A, 12/28/2013); and Laparoscopic vaginal hysterectomy with salpingectomy (Left, 12/07/2018).   Her family history includes Hypertension in her father and mother; Thyroid disease in her mother.She reports that she quit smoking about 22 years ago. She has never used  smokeless tobacco. She reports current alcohol use of about 6.0 standard drinks of alcohol per week. She reports that she does not use drugs.  Outpatient Medications Prior to Visit  Medication Sig Dispense Refill  . ergocalciferol (VITAMIN D2) 1.25 MG (50000 UT) capsule ergocalciferol (vitamin D2) 1,250 mcg (50,000 unit) capsule  Take 1 capsule by oral route.  Once completed, get OTC Vitamin D 4000 IU. Take 1 po daily    . spironolactone (ALDACTONE) 50 MG tablet spironolactone 50 mg tablet    . acetaminophen (TYLENOL) 325 MG tablet Take 2 tablets (650 mg total) by mouth every 6 (six) hours as needed for mild pain (temperature > 101.5.). 60 tablet 0  . methocarbamol (ROBAXIN) 500 MG tablet Take 1 tablet (500 mg total) by mouth every 6 (six) hours as needed for muscle spasms. 30 tablet 0  . oxyCODONE (OXY IR/ROXICODONE) 5 MG immediate release tablet Take 1 tablet (5 mg total) by mouth every 6 (six) hours as needed for moderate pain. 20 tablet 0   No facility-administered medications prior to visit.    ROS Review of Systems  Constitutional: Negative.  Negative for diaphoresis, fatigue and unexpected weight change.  HENT: Negative.   Eyes: Negative.   Respiratory: Negative for cough, chest tightness, shortness of breath and wheezing.   Cardiovascular: Positive for palpitations. Negative for chest pain and leg swelling.  Gastrointestinal: Negative for abdominal pain, constipation, diarrhea, nausea and vomiting.  Endocrine: Negative for cold intolerance and heat intolerance.  Genitourinary: Negative.   Musculoskeletal: Negative.   Skin: Negative.  Negative for color change.  Neurological: Negative.  Negative for dizziness, syncope, weakness and light-headedness.  Hematological: Negative for adenopathy. Does not bruise/bleed easily.  Psychiatric/Behavioral: Negative.     Objective:  BP 120/80 (BP Location: Left Arm, Patient Position: Sitting, Cuff Size: Normal)   Pulse 73   Temp 98.3 F  (36.8 C) (Oral)   Resp 16   Ht 5' 3"  (1.6 m)   Wt 132 lb (59.9 kg)   LMP 05/22/2018   SpO2 96%   BMI 23.38 kg/m   Physical Exam Vitals reviewed.  Constitutional:      Appearance: Normal appearance. She is not ill-appearing.  HENT:     Nose: Nose normal.  Eyes:     General: No scleral icterus.    Conjunctiva/sclera: Conjunctivae normal.  Cardiovascular:     Rate and Rhythm: Normal rate and regular rhythm.     Heart sounds: Normal heart sounds, S1 normal and S2 normal. No murmur heard.     Comments: EKG- NSR, 68 bpm NS ST changes in all leads No old EKG for comparison Pulmonary:     Effort: Pulmonary effort is normal.     Breath sounds: No stridor. No wheezing, rhonchi or rales.  Abdominal:     General: Abdomen is flat. Bowel sounds are normal. There is no distension.     Palpations: Abdomen is soft. There is no hepatomegaly, splenomegaly or mass.     Tenderness: There is no abdominal tenderness.  Musculoskeletal:        General: Normal range of motion.     Cervical back: Neck supple.     Right lower leg: No edema.     Left lower leg: No edema.  Skin:    General: Skin is warm and dry.     Coloration: Skin is not pale.  Neurological:     General: No focal deficit present.     Mental Status: She is alert. Mental status is at baseline.  Psychiatric:        Mood and Affect: Mood normal.        Behavior: Behavior normal.     Lab Results  Component Value Date   WBC 4.8 06/05/2020   HGB 12.6 06/05/2020   HCT 37.2 06/05/2020   PLT 186.0 06/05/2020   GLUCOSE 103 (H) 06/05/2020   CHOL 205 (A) 11/01/2019   TRIG 52 11/01/2019   HDL 84 (A) 11/01/2019   LDLCALC 111 11/01/2019   ALT 10 04/18/2017   AST 15 04/18/2017   NA 139 06/05/2020   K 4.2 06/05/2020   CL 102 06/05/2020   CREATININE 0.86 06/05/2020   BUN 12 06/05/2020   CO2 31 06/05/2020   TSH 0.78 06/05/2020   INR 1.1 06/24/2008    Assessment & Plan:   Sophy was seen today for annual exam and  palpitations.  Diagnoses and all orders for this visit:  Palpitations -     EKG 12-Lead  Rapid palpitations- Her EKG shows normal sinus rhythm.  Her labs are negative for secondary causes.  I recommended that she undergo a cardiac event monitor to see if she has a dysrhythmia. -     CBC with Differential/Platelet; Future -     Basic metabolic panel; Future -     Magnesium; Future -     TSH; Future -     T4, free; Future -     T3; Future -     CARDIAC EVENT MONITOR; Future -     T3 -     T4, free -  TSH -     Magnesium -     Basic metabolic panel -     CBC with Differential/Platelet  Vitamin D deficiency disease- Her vitamin D level is normal now. -     VITAMIN D 25 Hydroxy (Vit-D Deficiency, Fractures); Future -     VITAMIN D 25 Hydroxy (Vit-D Deficiency, Fractures)  Colon cancer screening -     Cologuard  Encounter for general adult medical examination with abnormal findings- Exam completed, labs reviewed, vaccines reviewed and updated, cancer screenings addressed, patient education material was given.   I have discontinued Elmyra Ricks E. Stankiewicz's acetaminophen, oxyCODONE, and methocarbamol. I am also having her maintain her ergocalciferol and spironolactone.  No orders of the defined types were placed in this encounter.    Follow-up: Return in about 3 months (around 09/05/2020).  Scarlette Calico, MD

## 2020-06-05 NOTE — Patient Instructions (Signed)
Palpitations Palpitations are feelings that your heartbeat is irregular or is faster than normal. It may feel like your heart is fluttering or skipping a beat. Palpitations are usually not a serious problem. They may be caused by many things, including smoking, caffeine, alcohol, stress, and certain medicines or drugs. Most causes of palpitations are not serious. However, some palpitations can be a sign of a serious problem. You may need further tests to rule out serious medical problems. Follow these instructions at home: Pay attention to any changes in your condition. Take these actions to help manage your symptoms: Eating and drinking  Avoid foods and drinks that may cause palpitations. These may include: ? Caffeinated coffee, tea, soft drinks, diet pills, and energy drinks. ? Chocolate. ? Alcohol. Lifestyle  Take steps to reduce your stress and anxiety. Things that can help you relax include: ? Yoga. ? Mind-body activities, such as deep breathing, meditation, or using words and images to create positive thoughts (guided imagery). ? Physical activity, such as swimming, jogging, or walking. Tell your health care provider if your palpitations increase with activity. If you have chest pain or shortness of breath with activity, do not continue the activity until you are seen by your health care provider. ? Biofeedback. This is a method that helps you learn to use your mind to control things in your body, such as your heartbeat.  Do not use drugs, including cocaine or ecstasy. Do not use marijuana.  Get plenty of rest and sleep. Keep a regular bed time. General instructions  Take over-the-counter and prescription medicines only as told by your health care provider.  Do not use any products that contain nicotine or tobacco, such as cigarettes and e-cigarettes. If you need help quitting, ask your health care provider.  Keep all follow-up visits as told by your health care provider. This is  important. These may include visits for further testing if palpitations do not go away or get worse.      Contact a health care provider if you:  Continue to have a fast or irregular heartbeat after 24 hours.  Notice that your palpitations occur more often. Get help right away if you:  Have chest pain or shortness of breath.  Have a severe headache.  Feel dizzy or you faint. Summary  Palpitations are feelings that your heartbeat is irregular or is faster than normal. It may feel like your heart is fluttering or skipping a beat.  Palpitations may be caused by many things, including smoking, caffeine, alcohol, stress, certain medicines, and drugs.  Although most causes of palpitations are not serious, some causes can be a sign of a serious medical problem.  Get help right away if you faint or have chest pain, shortness of breath, a severe headache, or dizziness. This information is not intended to replace advice given to you by your health care provider. Make sure you discuss any questions you have with your health care provider. Document Revised: 02/16/2017 Document Reviewed: 02/16/2017 Elsevier Patient Education  2021 [employer].

## 2020-06-06 ENCOUNTER — Encounter: Payer: Self-pay | Admitting: Internal Medicine

## 2020-06-06 DIAGNOSIS — Z1211 Encounter for screening for malignant neoplasm of colon: Secondary | ICD-10-CM | POA: Insufficient documentation

## 2020-06-06 DIAGNOSIS — Z0001 Encounter for general adult medical examination with abnormal findings: Secondary | ICD-10-CM | POA: Insufficient documentation

## 2020-06-06 LAB — T3: T3, Total: 112 ng/dL (ref 76–181)

## 2020-06-07 ENCOUNTER — Encounter: Payer: Self-pay | Admitting: Internal Medicine

## 2020-06-10 ENCOUNTER — Ambulatory Visit (INDEPENDENT_AMBULATORY_CARE_PROVIDER_SITE_OTHER): Payer: PRIVATE HEALTH INSURANCE

## 2020-06-10 DIAGNOSIS — R002 Palpitations: Secondary | ICD-10-CM

## 2020-06-18 LAB — COLOGUARD: Cologuard: NEGATIVE

## 2020-06-23 LAB — COLOGUARD: Cologuard: NEGATIVE

## 2021-03-31 ENCOUNTER — Ambulatory Visit: Payer: PRIVATE HEALTH INSURANCE | Admitting: Internal Medicine

## 2021-07-13 ENCOUNTER — Ambulatory Visit: Payer: PRIVATE HEALTH INSURANCE | Admitting: Emergency Medicine

## 2021-07-13 ENCOUNTER — Encounter: Payer: Self-pay | Admitting: Emergency Medicine

## 2021-07-13 VITALS — BP 122/68 | HR 63 | Temp 97.8°F | Ht 63.0 in | Wt 138.2 lb

## 2021-07-13 DIAGNOSIS — R1011 Right upper quadrant pain: Secondary | ICD-10-CM

## 2021-07-13 LAB — URINALYSIS
Bilirubin Urine: NEGATIVE
Ketones, ur: NEGATIVE
Leukocytes,Ua: NEGATIVE
Nitrite: NEGATIVE
Specific Gravity, Urine: <=1.005 — AB (ref 1.000–1.030)
Total Protein, Urine: NEGATIVE
Urine Glucose: NEGATIVE
Urobilinogen, UA: 0.2 (ref 0.0–1.0)
pH: 6 (ref 5.0–8.0)

## 2021-07-13 LAB — CBC WITH DIFFERENTIAL/PLATELET
Basophils Absolute: 0 10*3/uL (ref 0.0–0.1)
Basophils Relative: 0.6 % (ref 0.0–3.0)
Eosinophils Absolute: 0 10*3/uL (ref 0.0–0.7)
Eosinophils Relative: 0.6 % (ref 0.0–5.0)
HCT: 38.1 % (ref 36.0–46.0)
Hemoglobin: 12.9 g/dL (ref 12.0–15.0)
Lymphocytes Relative: 34.5 % (ref 12.0–46.0)
Lymphs Abs: 1.4 10*3/uL (ref 0.7–4.0)
MCHC: 33.8 g/dL (ref 30.0–36.0)
MCV: 89 fl (ref 78.0–100.0)
Monocytes Absolute: 0.6 10*3/uL (ref 0.1–1.0)
Monocytes Relative: 14 % — ABNORMAL HIGH (ref 3.0–12.0)
Neutro Abs: 2 10*3/uL (ref 1.4–7.7)
Neutrophils Relative %: 50.3 % (ref 43.0–77.0)
Platelets: 161 10*3/uL (ref 150.0–400.0)
RBC: 4.28 Mil/uL (ref 3.87–5.11)
RDW: 12.2 % (ref 11.5–15.5)
WBC: 4 10*3/uL (ref 4.0–10.5)

## 2021-07-13 LAB — COMPREHENSIVE METABOLIC PANEL
ALT: 11 U/L (ref 0–35)
AST: 16 U/L (ref 0–37)
Albumin: 4.7 g/dL (ref 3.5–5.2)
Alkaline Phosphatase: 55 U/L (ref 39–117)
BUN: 14 mg/dL (ref 6–23)
CO2: 27 mEq/L (ref 19–32)
Calcium: 9.7 mg/dL (ref 8.4–10.5)
Chloride: 101 mEq/L (ref 96–112)
Creatinine, Ser: 0.76 mg/dL (ref 0.40–1.20)
GFR: 92.24 mL/min (ref >60.00)
Glucose, Bld: 85 mg/dL (ref 70–99)
Potassium: 4.1 mEq/L (ref 3.5–5.1)
Sodium: 137 mEq/L (ref 135–145)
Total Bilirubin: 0.9 mg/dL (ref 0.2–1.2)
Total Protein: 7.4 g/dL (ref 6.0–8.3)

## 2021-07-13 LAB — LIPASE: Lipase: 7 U/L — ABNORMAL LOW (ref 11.0–59.0)

## 2021-07-14 ENCOUNTER — Ambulatory Visit
Admission: RE | Admit: 2021-07-14 | Discharge: 2021-07-14 | Disposition: A | Discharge status: Home / Self Care | Payer: PRIVATE HEALTH INSURANCE | Source: Ambulatory Visit | Attending: Emergency Medicine | Admitting: Emergency Medicine

## 2021-07-14 DIAGNOSIS — R1011 Right upper quadrant pain: Secondary | ICD-10-CM

## 2021-07-15 ENCOUNTER — Other Ambulatory Visit: Payer: Self-pay | Admitting: Emergency Medicine

## 2021-07-15 DIAGNOSIS — K7689 Other specified diseases of liver: Secondary | ICD-10-CM

## 2021-07-16 ENCOUNTER — Other Ambulatory Visit: Payer: Self-pay | Admitting: Gastroenterology

## 2021-07-16 ENCOUNTER — Other Ambulatory Visit (HOSPITAL_COMMUNITY): Payer: Self-pay | Admitting: Gastroenterology

## 2021-07-16 DIAGNOSIS — R1011 Right upper quadrant pain: Secondary | ICD-10-CM

## 2021-07-31 ENCOUNTER — Other Ambulatory Visit (HOSPITAL_COMMUNITY): Payer: PRIVATE HEALTH INSURANCE

## 2021-07-31 ENCOUNTER — Encounter (HOSPITAL_COMMUNITY): Payer: Self-pay

## 2021-09-25 NOTE — Telephone Encounter (Signed)
NOTE NOT NEEDED ?

## 2021-12-17 ENCOUNTER — Encounter: Payer: Self-pay | Admitting: Nurse Practitioner

## 2021-12-17 ENCOUNTER — Ambulatory Visit: Payer: PRIVATE HEALTH INSURANCE | Admitting: Nurse Practitioner

## 2021-12-17 VITALS — BP 114/74 | HR 95 | Temp 99.2°F | Ht 63.0 in | Wt 134.2 lb

## 2021-12-17 DIAGNOSIS — U071 COVID-19: Secondary | ICD-10-CM

## 2021-12-17 LAB — POCT RESPIRATORY SYNCYTIAL VIRUS: RSV Rapid Ag: NEGATIVE

## 2021-12-17 LAB — POCT RAPID STREP A (OFFICE): Rapid Strep A Screen: NEGATIVE

## 2021-12-17 LAB — POC COVID19 BINAXNOW: SARS Coronavirus 2 Ag: POSITIVE — AB

## 2021-12-17 LAB — POCT INFLUENZA A/B
Influenza A, POC: NEGATIVE
Influenza B, POC: NEGATIVE

## 2021-12-17 MED ORDER — BENZONATATE 200 MG PO CAPS: 200.0000 mg | ORAL_CAPSULE | Freq: Two times a day (BID) | ORAL | 0 refills | Status: DC | PRN

## 2021-12-17 NOTE — Patient Instructions (Signed)
If symptoms get acutely worse and you experience shortness of breath, confusion, high fever call 911

## 2021-12-17 NOTE — Assessment & Plan Note (Signed)
Acute, appears low risk for hospitalization due to COVID-19 infection.  For now treat symptoms with rest, hydration, Tessalon Perles for cough suppression.  Patient encouraged to call office if symptoms persist or proceed to the hospital if symptoms get acutely severe.  Patient reports understanding.  We did discuss antiviral medication and per shared decision making we will hold off on prescription for this, however patient was encouraged to call office tomorrow if she changes her mind.  Patient reports understanding.  Patient was also educated on current isolation guidelines.  Work note provided.

## 2021-12-17 NOTE — Progress Notes (Signed)
Established Patient Office Visit  Subjective   Patient ID: Allison Lowery, female    DOB: August 18, 1970  Age: 51 y.o. MRN: [id number]  Chief Complaint  Patient presents with   Sore Throat    Symptom onset 2 days ago, experiencing chills, fever, fatigue, dry cough, and chest tightness.  Has been vaccinated against COVID-19 x 3.  Has not yet tested for COVID-19 at home.    Review of Systems  Constitutional:  Positive for chills, fever and malaise/fatigue.  HENT:  Positive for ear pain. Negative for congestion.   Respiratory:  Positive for cough. Negative for sputum production and shortness of breath.   Cardiovascular:  Positive for chest pain (tightness).      Objective:     BP 114/74   Pulse 95   Temp 99.2 F (37.3 C) (Temporal)   Ht '5\' 3"'$  (1.6 m)   Wt 134 lb 4 oz (60.9 kg)   LMP 05/22/2018   SpO2 98%   BMI 23.78 kg/m    Physical Exam Vitals reviewed.  Constitutional:      General: She is not in acute distress.    Appearance: Normal appearance.  HENT:     Head: Normocephalic and atraumatic.  Neck:     Vascular: No carotid bruit.  Cardiovascular:     Rate and Rhythm: Normal rate and regular rhythm.     Pulses: Normal pulses.     Heart sounds: Normal heart sounds.  Pulmonary:     Effort: Pulmonary effort is normal.     Breath sounds: Normal breath sounds.  Skin:    General: Skin is warm and dry.  Neurological:     General: No focal deficit present.     Mental Status: She is alert and oriented to person, place, and time.  Psychiatric:        Mood and Affect: Mood normal.        Behavior: Behavior normal.        Judgment: Judgment normal.      Results for orders placed or performed in visit on 12/17/21  POCT Influenza A/B  Result Value Ref Range   Influenza A, POC Negative Negative   Influenza B, POC Negative Negative  POCT rapid strep A  Result Value Ref Range   Rapid Strep A Screen Negative Negative  POCT respiratory syncytial virus  Result  Value Ref Range   RSV Rapid Ag negative   POC COVID-19  Result Value Ref Range   SARS Coronavirus 2 Ag Positive (A) Negative      The 10-year ASCVD risk score (Arnett DK, et al., 2019) is: 0.5%* (Cholesterol units were assumed)    Assessment & Plan:   Problem List Items Addressed This Visit       Other   COVID-19 - Primary    Acute, appears low risk for hospitalization due to COVID-19 infection.  For now treat symptoms with rest, hydration, Tessalon Perles for cough suppression.  Patient encouraged to call office if symptoms persist or proceed to the hospital if symptoms get acutely severe.  Patient reports understanding.  We did discuss antiviral medication and per shared decision making we will hold off on prescription for this, however patient was encouraged to call office tomorrow if she changes her mind.  Patient reports understanding.  Patient was also educated on current isolation guidelines.  Work note provided.      Relevant Medications   benzonatate (TESSALON) 200 MG capsule   Other Relevant Orders   POCT  Influenza A/B (Completed)   POCT rapid strep A (Completed)   POCT respiratory syncytial virus (Completed)   POC COVID-19 (Completed)    Return if symptoms worsen or fail to improve.    Ailene Ards, NP

## 2021-12-18 ENCOUNTER — Encounter: Payer: Self-pay | Admitting: Nurse Practitioner

## 2021-12-22 ENCOUNTER — Telehealth: Payer: Self-pay | Admitting: Internal Medicine

## 2021-12-22 NOTE — Telephone Encounter (Signed)
Good morning! PT is requesting a potential TOC from Dr.Jones to NP Jeralyn Ruths. PT has only seen the Dr.Jones the one time and stated since she had seen Judson Roch earlier this year she was wanting to try to stick with her. I'll be making PT aware that we will need an approval from both providers before proceeding.

## 2022-02-04 ENCOUNTER — Ambulatory Visit: Payer: PRIVATE HEALTH INSURANCE | Admitting: Nurse Practitioner

## 2022-02-04 VITALS — BP 122/80 | HR 55 | Temp 97.8°F | Ht 63.0 in | Wt 132.4 lb

## 2022-02-04 DIAGNOSIS — Z131 Encounter for screening for diabetes mellitus: Secondary | ICD-10-CM

## 2022-02-04 DIAGNOSIS — Z13220 Encounter for screening for lipoid disorders: Secondary | ICD-10-CM

## 2022-02-04 DIAGNOSIS — R202 Paresthesia of skin: Secondary | ICD-10-CM | POA: Insufficient documentation

## 2022-02-04 DIAGNOSIS — E559 Vitamin D deficiency, unspecified: Secondary | ICD-10-CM | POA: Diagnosis not present

## 2022-02-04 DIAGNOSIS — K7689 Other specified diseases of liver: Secondary | ICD-10-CM

## 2022-02-04 DIAGNOSIS — Z0001 Encounter for general adult medical examination with abnormal findings: Secondary | ICD-10-CM

## 2022-02-04 DIAGNOSIS — Z1159 Encounter for screening for other viral diseases: Secondary | ICD-10-CM

## 2022-02-04 DIAGNOSIS — R5383 Other fatigue: Secondary | ICD-10-CM

## 2022-02-04 DIAGNOSIS — Z136 Encounter for screening for cardiovascular disorders: Secondary | ICD-10-CM

## 2022-02-04 LAB — CBC
HCT: 36.9 % (ref 36.0–46.0)
Hemoglobin: 12.5 g/dL (ref 12.0–15.0)
MCHC: 34 g/dL (ref 30.0–36.0)
MCV: 89.4 fl (ref 78.0–100.0)
Platelets: 193 10*3/uL (ref 150.0–400.0)
RBC: 4.13 Mil/uL (ref 3.87–5.11)
RDW: 12.9 % (ref 11.5–15.5)
WBC: 4.9 10*3/uL (ref 4.0–10.5)

## 2022-02-04 LAB — TSH: TSH: 0.64 u[IU]/mL (ref 0.35–5.50)

## 2022-02-04 NOTE — Assessment & Plan Note (Signed)
Based on history favor carpal tunnel syndrome.  Recommend trial of wrist brace at night, if symptoms persist consider referral to neurology for nerve conduction studies.

## 2022-02-04 NOTE — Assessment & Plan Note (Signed)
Follow-up ultrasound ordered today for evaluation of cysts.  Further recommendations may be made based upon these results.

## 2022-02-04 NOTE — Progress Notes (Signed)
Established Patient Office Visit  Subjective   Patient ID: Allison Lowery, female    DOB: 11/23/1970  Age: 52 y.o. MRN: [id number]  Chief Complaint  Patient presents with   Annual Exam   Health maintenance: Due for hep C screening, due for Pap smear and mammogram.  Up-to-date with colon cancer screening via Cologuard in 2022.  Up-to-date with tetanus vaccine.  Would be due for STI screening the patient has elected to defer this.  Has had flu shot this season.  Reports history of vitamin D deficiency, not currently on supplementation.  Per chart review I do see where she had ultrasound of abdomen completed approximately 6 months ago which showed complex cyst to her liver.  Recommendation was to have follow-up ultrasound in 6 months.  Additionally she had evaluation with Dr. Collene Mares and per patient was recommended to undergo biopsy but she never did undergo biopsy due to her symptoms resolving.  Also reports tingling to her thumb and index finger bilateral hands intermittently.  Patient denies any neck pain, history of neck injury, history of car accident.  Does work on a Teaching laboratory technician.      Review of Systems  Constitutional:  Positive for malaise/fatigue. Negative for fever and weight loss.  Respiratory:  Negative for cough, shortness of breath and wheezing.   Cardiovascular:  Positive for palpitations (chronic history of intermittent arrythmia). Negative for chest pain.  Gastrointestinal:  Negative for abdominal pain and blood in stool.  Neurological:  Negative for seizures and loss of consciousness.  Psychiatric/Behavioral:  Negative for depression and suicidal ideas. The patient is nervous/anxious.       Objective:     BP 122/80   Pulse (!) 55   Temp 97.8 F (36.6 C) (Oral)   Ht '5\' 3"'$  (1.6 m)   Wt 132 lb 6 oz (60 kg)   LMP 05/22/2018   SpO2 99%   BMI 23.45 kg/m    Physical Exam Vitals reviewed. Exam conducted with a chaperone present.  Constitutional:      Appearance:  Normal appearance.  HENT:     Head: Normocephalic and atraumatic.     Right Ear: Tympanic membrane, ear canal and external ear normal.     Left Ear: Tympanic membrane, ear canal and external ear normal.  Eyes:     General:        Right eye: No discharge.        Left eye: No discharge.     Extraocular Movements: Extraocular movements intact.     Conjunctiva/sclera: Conjunctivae normal.     Pupils: Pupils are equal, round, and reactive to light.  Neck:     Vascular: No carotid bruit.  Cardiovascular:     Rate and Rhythm: Normal rate and regular rhythm.     Pulses: Normal pulses.     Heart sounds: Normal heart sounds. No murmur heard. Pulmonary:     Effort: Pulmonary effort is normal.     Breath sounds: Normal breath sounds.  Chest:  Breasts:    Breasts are symmetrical.     Right: Normal.     Left: Normal.  Abdominal:     General: Abdomen is flat. Bowel sounds are normal. There is no distension.     Palpations: Abdomen is soft. There is no mass.     Tenderness: There is no abdominal tenderness.  Musculoskeletal:        General: No tenderness.     Cervical back: Neck supple. No muscular tenderness.  Right lower leg: No edema.     Left lower leg: No edema.  Lymphadenopathy:     Cervical: No cervical adenopathy.     Upper Body:     Right upper body: No supraclavicular adenopathy.     Left upper body: No supraclavicular adenopathy.  Skin:    General: Skin is warm and dry.  Neurological:     General: No focal deficit present.     Mental Status: She is alert and oriented to person, place, and time.     Motor: No weakness.     Gait: Gait normal.     Comments: Tinel and Phalen test negative  Psychiatric:        Mood and Affect: Mood normal.        Behavior: Behavior normal.        Judgment: Judgment normal.      No results found for any visits on 02/04/22.    The 10-year ASCVD risk score (Arnett DK, et al., 2019) is: 0.6%* (Cholesterol units were assumed)     Assessment & Plan:   Problem List Items Addressed This Visit       Digestive   Liver cyst    Follow-up ultrasound ordered today for evaluation of cysts.  Further recommendations may be made based upon these results.      Relevant Orders   US Abdomen Limited RUQ (LIVER/GB)     Other   Vitamin D deficiency    Labs ordered today, further recommendations may be made based upon his results.      Relevant Orders   TSH   Hemoglobin A1c   Lipid panel   Comprehensive metabolic panel   CBC   Hepatitis C antibody   VITAMIN D 25 Hydroxy (Vit-D Deficiency, Fractures)   Diabetes mellitus screening    Labs ordered today, further recommendations may be made based upon his results.      Relevant Orders   TSH   Hemoglobin A1c   Lipid panel   Comprehensive metabolic panel   CBC   Hepatitis C antibody   VITAMIN D 25 Hydroxy (Vit-D Deficiency, Fractures)   Encounter for lipid screening for cardiovascular disease    Labs ordered today, further recommendations may be made based upon his results.      Relevant Orders   TSH   Hemoglobin A1c   Lipid panel   Comprehensive metabolic panel   CBC   Hepatitis C antibody   VITAMIN D 25 Hydroxy (Vit-D Deficiency, Fractures)   Encounter for hepatitis C screening test for low risk patient    Labs ordered today, further recommendations may be made based upon his results.      Relevant Orders   TSH   Hemoglobin A1c   Lipid panel   Comprehensive metabolic panel   CBC   Hepatitis C antibody   VITAMIN D 25 Hydroxy (Vit-D Deficiency, Fractures)   Encounter for general adult medical examination with abnormal findings - Primary    Overall exam within normal limits, labs ordered for further evaluation.  Recommend let healthy lifestyle with diet, hydration, and exercise.  Also discussed routine health screenings.  Handout regarding recommendations provided to patient today.      Relevant Orders   TSH   Hemoglobin A1c   Lipid panel    Comprehensive metabolic panel   CBC   Hepatitis C antibody   VITAMIN D 25 Hydroxy (Vit-D Deficiency, Fractures)   Fatigue    Labs ordered today, further recommendations may be made  based upon his results.      Relevant Orders   TSH   Hemoglobin A1c   Lipid panel   Comprehensive metabolic panel   CBC   Hepatitis C antibody   VITAMIN D 25 Hydroxy (Vit-D Deficiency, Fractures)   Paresthesia    Based on history favor carpal tunnel syndrome.  Recommend trial of wrist brace at night, if symptoms persist consider referral to neurology for nerve conduction studies.       Return in about 1 year (around 02/05/2023) for CPE with Tarren Sabree.    Ailene Ards, NP

## 2022-02-04 NOTE — Assessment & Plan Note (Signed)
Labs ordered today, further recommendations may be made based upon his results.

## 2022-02-04 NOTE — Assessment & Plan Note (Signed)
Overall exam within normal limits, labs ordered for further evaluation.  Recommend let healthy lifestyle with diet, hydration, and exercise.  Also discussed routine health screenings.  Handout regarding recommendations provided to patient today.

## 2022-02-05 LAB — VITAMIN D 25 HYDROXY (VIT D DEFICIENCY, FRACTURES): VITD: 29.01 ng/mL — ABNORMAL LOW (ref 30.00–100.00)

## 2022-02-05 LAB — COMPREHENSIVE METABOLIC PANEL
ALT: 12 U/L (ref 0–35)
AST: 18 U/L (ref 0–37)
Albumin: 4.7 g/dL (ref 3.5–5.2)
Alkaline Phosphatase: 48 U/L (ref 39–117)
BUN: 9 mg/dL (ref 6–23)
CO2: 27 mEq/L (ref 19–32)
Calcium: 10.1 mg/dL (ref 8.4–10.5)
Chloride: 100 mEq/L (ref 96–112)
Creatinine, Ser: 0.76 mg/dL (ref 0.40–1.20)
GFR: 91.87 mL/min (ref >60.00)
Glucose, Bld: 83 mg/dL (ref 70–99)
Potassium: 4.7 mEq/L (ref 3.5–5.1)
Sodium: 139 mEq/L (ref 135–145)
Total Bilirubin: 0.6 mg/dL (ref 0.2–1.2)
Total Protein: 7.5 g/dL (ref 6.0–8.3)

## 2022-02-05 LAB — LIPID PANEL
Cholesterol: 212 mg/dL — ABNORMAL HIGH (ref 0–200)
HDL: 93.8 mg/dL (ref >39.00)
LDL Cholesterol: 108 mg/dL — ABNORMAL HIGH (ref 0–99)
NonHDL: 117.84
Total CHOL/HDL Ratio: 2
Triglycerides: 51 mg/dL (ref 0.0–149.0)
VLDL: 10.2 mg/dL (ref 0.0–40.0)

## 2022-02-05 LAB — HEMOGLOBIN A1C: Hgb A1c MFr Bld: 5.8 % (ref 4.6–6.5)

## 2022-02-05 LAB — HEPATITIS C ANTIBODY: Hepatitis C Ab: NONREACTIVE

## 2022-04-01 ENCOUNTER — Other Ambulatory Visit: Payer: Self-pay

## 2022-04-01 ENCOUNTER — Emergency Department (HOSPITAL_COMMUNITY)
Admission: EM | Admit: 2022-04-01 | Discharge: 2022-04-01 | Disposition: A | Discharge status: Home / Self Care | Payer: PRIVATE HEALTH INSURANCE | Attending: Emergency Medicine | Admitting: Emergency Medicine

## 2022-04-01 ENCOUNTER — Emergency Department (HOSPITAL_COMMUNITY): Payer: PRIVATE HEALTH INSURANCE

## 2022-04-01 ENCOUNTER — Encounter (HOSPITAL_COMMUNITY): Payer: Self-pay

## 2022-04-01 ENCOUNTER — Ambulatory Visit: Payer: PRIVATE HEALTH INSURANCE | Admitting: Nurse Practitioner

## 2022-04-01 VITALS — BP 112/82 | HR 65 | Temp 98.1°F | Ht 63.0 in | Wt 135.0 lb

## 2022-04-01 DIAGNOSIS — M79644 Pain in right finger(s): Secondary | ICD-10-CM | POA: Diagnosis not present

## 2022-04-01 DIAGNOSIS — L03011 Cellulitis of right finger: Secondary | ICD-10-CM

## 2022-04-01 DIAGNOSIS — M7989 Other specified soft tissue disorders: Secondary | ICD-10-CM | POA: Diagnosis not present

## 2022-04-01 DIAGNOSIS — L03019 Cellulitis of unspecified finger: Secondary | ICD-10-CM | POA: Insufficient documentation

## 2022-04-01 DIAGNOSIS — X58XXXA Exposure to other specified factors, initial encounter: Secondary | ICD-10-CM | POA: Diagnosis not present

## 2022-04-01 DIAGNOSIS — R21 Rash and other nonspecific skin eruption: Secondary | ICD-10-CM | POA: Diagnosis not present

## 2022-04-01 MED ORDER — DEXAMETHASONE SODIUM PHOSPHATE 4 MG/ML IJ SOLN
4.0000 mg | Freq: Once | INTRAMUSCULAR | Status: AC
  Administered 2022-04-01: 4 mg via INTRAMUSCULAR
  Filled 2022-04-01: qty 1

## 2022-04-01 MED ORDER — DEXAMETHASONE SODIUM PHOSPHATE 10 MG/ML IJ SOLN: 6.0000 mg | Freq: Once | INTRAMUSCULAR | Status: DC

## 2022-04-01 NOTE — ED Notes (Signed)
Pt resting in bed awaiting results. No acute distress noted.

## 2022-04-01 NOTE — ED Provider Notes (Signed)
[place] EMERGENCY DEPARTMENT AT [facility] Provider Note   CSN: [id number] Arrival date & time: 04/01/22  1714     History  Chief Complaint  Patient presents with   Finger Injury    Allison Lowery is a 52 y.o. female.  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  She states that her pain is worse during the night.  She states that she went to an urgent care this past week and had an x-ray done on Sunday which showed no fractures.  She was told she had a sprain of the joint.  She states that her finger then became more painful and she went to her primary care doctor today who recommended she see hand surgeon however she was unable to see 1 today and came to emergency room as recommended by her PCP.  She denies any numbness and is able to move the joint but states that the swelling makes it difficult to move it completely.     Home Medications Prior to Admission medications   Medication Sig Start Date End Date Taking? Authorizing Provider  ergocalciferol (VITAMIN D2) 1.25 MG (50000 UT) capsule     [provider]  Multiple Vitamin (MULTIVITAMIN ADULT PO) Take by mouth.    [provider]      Allergies    Amoxicillin, Erythromycin, Penicillin g benzathine, Sulfamethoxazole-trimethoprim, Tetracycline, and Latex    Review of Systems   Review of Systems  Physical Exam Updated Vital Signs BP 121/75   Pulse 63   Temp 98.5 F (36.9 C) (Oral)   Resp 18   Ht 5\' 3"  (1.6 m)   Wt 61.2 kg   LMP 05/22/2018   SpO2 99%   BMI 23.91 kg/m  Physical Exam Vitals and nursing note reviewed.  Constitutional:      General: She is not in acute distress.    Appearance: Normal appearance. She  is not ill-appearing.  HENT:     Head: Normocephalic and atraumatic.     Mouth/Throat:     Mouth: Mucous membranes are moist.  Eyes:     General: No scleral icterus.       Right eye: No discharge.        Left eye: No discharge.     Conjunctiva/sclera: Conjunctivae normal.  Pulmonary:     Effort: Pulmonary effort is normal.     Breath sounds: No stridor.  Musculoskeletal:     Comments: Able to flex and extend at DIP although movement is somewhat restricted due to some discomfort and swelling.  Full range of motion at MCP and PIP.  Cap refill 2 seconds.  Sensation intact in fingertips.  Radial artery pulses 3+  Skin:    General: Skin is warm and dry.     Comments: Faint somewhat papular rash to left side of neck and left side of face  Neurological:     Mental Status: She is alert and oriented to person, place, and time. Mental status is at baseline.        ED Results / Procedures / Treatments   Labs (all labs ordered are listed, but only abnormal results are displayed) Labs Reviewed - No data to display  EKG None  Radiology DG Finger Index Right  Result Date: 04/01/2022 CLINICAL DATA:  Index finger injury 2 weeks ago, swelling EXAM: RIGHT INDEX FINGER 3V COMPARISON:  None Available. FINDINGS: No evidence of fracture or dislocation. There is no evidence of arthropathy or other focal bone abnormality. Mild soft tissue edema of the right index finger. IMPRESSION: No fracture or dislocation of the right index finger. Mild soft tissue edema. Electronically Signed   By: Yetta Glassman M.D.   On: 04/01/2022 19:20    Procedures Procedures    Medications Ordered in ED Medications  dexamethasone (DECADRON) injection 4 mg (4 mg Intramuscular Given 04/01/22 2007)    ED Course/ Medical Decision Making/ A&P                             Medical Decision Making Amount and/or Complexity of Data Reviewed Radiology: ordered.  Risk Prescription drug management.   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  She states that her pain is worse during the night.  She states that she went to an urgent care this past week and had an x-ray done on Sunday which showed no fractures.  She was told she had a sprain of the joint.  She states that her finger then became more painful and she went to her primary care doctor today who recommended she see hand surgeon however she was unable to see 1 today and came to emergency room as recommended by her PCP.  She denies any numbness and is able to move the joint but states that the swelling makes it difficult to move it completely.  Patient has decreased range of motion at the DIP of right index finger and does have some swelling of the finger that is diffuse but seems to be only tender at the DIP.  Cap refill is 2 seconds and sensation is intact she is actually not tender on the pad at the end of the finger.  She was sent here because of concern for a felon which I do not appreciate.  I also do not believe that this finger is ischemic given the good cap refill sensation and I will low suspicion for ischemia.  She also has no significant risk factors for ischemia.  Seems that her symptoms been ongoing for 2+ weeks and although it is more painful especially during the night her symptoms seem to wax and wane which leads me to believe that this is more likely not infectious.  I do think following up with a hand surgeon or rheumatology is reasonable I have a high suspicion for this being an arthritis perhaps rheumatologic/autoimmune arthritis.  She has autoimmune diseases in her family.  Had a shared decision-making oversedation with patient who is agreeable to this plan.  I also discussed  this case with my attending physician who reviewed the x-ray and pictures that I placed in the chart.  She agrees with my plan.  Will discharge home at this time with return precautions.  Given 1 dose of 4 mg of Decadron IM.   Final Clinical Impression(s) / ED Diagnoses Final diagnoses:  Pain of finger of right hand    Rx / DC Orders ED Discharge Orders     None  Tedd Sias, [place] 04/02/22 1142    Lorelle Gibbs, DO 04/02/22 1556

## 2022-04-01 NOTE — Discharge Instructions (Addendum)
I recommend that she visit your primary care doctor and either following up with hand surgery or rheumatology.  I have given you the information for the hand surgeon who is on-call here in the emergency room today.  If you call in the morning you may be able to be seen either Friday or Monday.  Please use Tylenol or ibuprofen for pain.  You may use 600 mg ibuprofen every 6 hours or 1000 mg of Tylenol every 6 hours.  You may choose to alternate between the 2.  This would be most effective.  Not to exceed 4 g of Tylenol within 24 hours.  Not to exceed 3200 mg ibuprofen 24 hours.

## 2022-04-01 NOTE — ED Triage Notes (Signed)
Right hand, index finger injury 2 weeks ago, woke up with soreness at this time and began getting worse. Went to [facility] Sunday and had xray, no fracture in finger. Significant swelling and redness to finger. Radial pulses present. Denies fever, chills, body aches. Seen at PCP today and PCP wants pt seen in ED for hand specialist consult.

## 2022-04-01 NOTE — Patient Instructions (Signed)
[address], [place], [place]

## 2022-04-01 NOTE — Progress Notes (Signed)
Established Patient Office Visit  Subjective   Patient ID: MALAYSHA TKACZYK, female    DOB: 1970/04/17  Age: 52 y.o. MRN: [id number]  Chief Complaint  Patient presents with   Rash    Right index finger rash( 2 weeks), the finger is painful(pins and needle sensation), redness to it and swelling form middle to the tip of the finger. Which spread from neck to face and now chest. Not painfully or itchy  Went urgent care( xray was done on finger, not fractures)    2 week history of 2nd digit of right hand swelling, tenderness. Now has progressed to color changes with tingling/"pins and needles" sensation. Prior to symptom onset did hit her hand on coffee table, but reports trauma was not very severe did not result in break in skin of the finger of concern, did result in scratch to the 3rd digit of same hand. Went to urgent care over this past weekend had xray done ruled out fracture, but did show evidence of soft tissue swelling, no joint involvement.  Also has had rash eruption on chest and neck area.  Not itching not painful.    Review of Systems  Constitutional:  Negative for fever.  Respiratory:  Negative for shortness of breath.   Cardiovascular:  Negative for chest pain.  Skin:  Positive for rash.  Neurological:  Positive for sensory change.      Objective:     BP 112/82   Pulse 65   Temp 98.1 F (36.7 C) (Temporal)   Ht '5\' 3"'$  (1.6 m)   Wt 135 lb (61.2 kg)   LMP 05/22/2018   SpO2 98%   BMI 23.91 kg/m    Physical Exam Vitals reviewed.  Constitutional:      General: She is not in acute distress.    Appearance: Normal appearance.  HENT:     Head: Normocephalic and atraumatic.  Neck:     Vascular: No carotid bruit.  Cardiovascular:     Rate and Rhythm: Normal rate and regular rhythm.     Pulses: Normal pulses.     Heart sounds: Normal heart sounds.  Pulmonary:     Effort: Pulmonary effort is normal.     Breath sounds: Normal breath sounds.  Musculoskeletal:      Right hand: Swelling (2nd digit near DIP an PIP joints) and tenderness present. Decreased sensation. Normal pulse.  Skin:    General: Skin is warm and dry.       Neurological:     General: No focal deficit present.     Mental Status: She is alert and oriented to person, place, and time.  Psychiatric:        Mood and Affect: Mood normal.        Behavior: Behavior normal.        Judgment: Judgment normal.      No results found for any visits on 04/01/22.    The 10-year ASCVD risk score (Arnett DK, et al., 2019) is: 0.5%    Assessment & Plan:   Problem List Items Addressed This Visit       Musculoskeletal and Integument   Rash    Etiology unclear, not sure if this is related to symptoms of her finger.  Rash is not itching or painful.  Recommend further evaluation of rash in ER as well. Discussed possibly trialing a steroid cream, but patient would prefer to see ER provider         Other   Felon of  finger - Primary    Acute, also showing signs of ischemia. Back-up supervising physician was asked to assess patient as well, his recommendation is to get patient into see hand surgeon today if not send patient to ER due to possibility of felon with ischemia. Unfortunately we were unable to find a hand surgeon that could see her today. She was told to proceed to the emergency department. She has elected to transport herself to [facility] ER.        Return if symptoms worsen or fail to improve.    Ailene Ards, NP

## 2022-04-01 NOTE — Assessment & Plan Note (Addendum)
Etiology unclear, not sure if this is related to symptoms of her finger.  Rash is not itching or painful.  Recommend further evaluation of rash in ER as well. Discussed possibly trialing a steroid cream, but patient would prefer to see ER provider

## 2022-04-01 NOTE — Assessment & Plan Note (Addendum)
Acute, also showing signs of ischemia. Back-up supervising physician was asked to assess patient as well, his recommendation is to get patient into see hand surgeon today if not send patient to ER due to possibility of felon with ischemia. Unfortunately we were unable to find a hand surgeon that could see her today. She was told to proceed to the emergency department. She has elected to transport herself to [facility] ER.

## 2022-04-01 NOTE — ED Triage Notes (Signed)
Additional c/o rash to face, back, neck, and chest that worsens daily

## 2022-04-02 ENCOUNTER — Telehealth: Payer: Self-pay

## 2022-04-02 ENCOUNTER — Other Ambulatory Visit: Payer: Self-pay

## 2022-04-02 ENCOUNTER — Encounter: Payer: Self-pay | Admitting: Nurse Practitioner

## 2022-04-02 DIAGNOSIS — R21 Rash and other nonspecific skin eruption: Secondary | ICD-10-CM

## 2022-04-02 DIAGNOSIS — M7989 Other specified soft tissue disorders: Secondary | ICD-10-CM

## 2022-04-02 LAB — CBC WITH DIFFERENTIAL/PLATELET
Basophils Absolute: 0 10*3/uL (ref 0.0–0.1)
Basophils Relative: 0.2 % (ref 0.0–3.0)
Eosinophils Absolute: 0 10*3/uL (ref 0.0–0.7)
Eosinophils Relative: 0 % (ref 0.0–5.0)
HCT: 38.2 % (ref 36.0–46.0)
Hemoglobin: 13 g/dL (ref 12.0–15.0)
Lymphocytes Relative: 14.6 % (ref 12.0–46.0)
Lymphs Abs: 0.9 10*3/uL (ref 0.7–4.0)
MCHC: 34 g/dL (ref 30.0–36.0)
MCV: 89.8 fl (ref 78.0–100.0)
Monocytes Absolute: 0.5 10*3/uL (ref 0.1–1.0)
Monocytes Relative: 8.4 % (ref 3.0–12.0)
Neutro Abs: 4.6 10*3/uL (ref 1.4–7.7)
Neutrophils Relative %: 76.8 % (ref 43.0–77.0)
Platelets: 197 10*3/uL (ref 150.0–400.0)
RBC: 4.26 Mil/uL (ref 3.87–5.11)
RDW: 12.3 % (ref 11.5–15.5)
WBC: 6 10*3/uL (ref 4.0–10.5)

## 2022-04-02 LAB — SEDIMENTATION RATE: Sed Rate: 9 mm/hr (ref 0–20)

## 2022-04-02 NOTE — Addendum Note (Signed)
Addended by: Ailene Ards on: 04/02/2022 09:49 AM   Modules accepted: Orders

## 2022-04-02 NOTE — Transitions of Care (Post Inpatient/ED Visit) (Signed)
   04/02/2022  Name: LEIGHLA MIHELICH MRN: [id number] DOB: 1970/12/11  Today's TOC FU Call Status: Today's TOC FU Call Status:: Successful TOC FU Call Competed TOC FU Call Complete Date: 04/02/22  Transition Care Management Follow-up Telephone Call Date of Discharge: 04/01/22 Discharge Facility: [facility]) Type of Discharge: Emergency Department Reason for ED Visit: Other: (pain in fingers) How have you been since you were released from the hospital?: Better Any questions or concerns?: No  Items Reviewed: Did you receive and understand the discharge instructions provided?: Yes Medications obtained and verified?: Yes (Medications Reviewed) Any new allergies since your discharge?: No Dietary orders reviewed?: Yes Do you have support at home?: Yes People in Home: other relative(s)  Home Care and Equipment/Supplies: [facility] Ordered?: NA Any new equipment or medical supplies ordered?: NA  Functional Questionnaire: Do you need assistance with bathing/showering or dressing?: No Do you need assistance with meal preparation?: No Do you need assistance with eating?: No Do you have difficulty maintaining continence: No Do you need assistance with getting out of bed/getting out of a chair/moving?: No Do you have difficulty managing or taking your medications?: No  Folllow up appointments reviewed: PCP Follow-up appointment confirmed?: Yes Date of PCP follow-up appointment?: 04/02/22 Follow-up Provider: [facility] Follow-up appointment confirmed?: NA Do you need transportation to your follow-up appointment?: No Do you understand care options if your condition(s) worsen?: Yes-patient verbalized understanding Patient already seen by Charlynn Court, [place] Direct Dial [phone]

## 2022-04-05 ENCOUNTER — Encounter: Payer: Self-pay | Admitting: Nurse Practitioner

## 2022-04-05 NOTE — Telephone Encounter (Signed)
Please advise changing the referral to Dr Amil Amen (has opening in April)

## 2022-04-06 ENCOUNTER — Other Ambulatory Visit: Payer: Self-pay | Admitting: Nurse Practitioner

## 2022-04-06 DIAGNOSIS — R21 Rash and other nonspecific skin eruption: Secondary | ICD-10-CM

## 2022-04-06 DIAGNOSIS — M7989 Other specified soft tissue disorders: Secondary | ICD-10-CM

## 2022-04-14 LAB — CYCLIC CITRUL PEPTIDE ANTIBODY, IGG: Cyclic Citrullin Peptide Ab: <16 UNITS

## 2022-04-14 LAB — ANA: Anti Nuclear Antibody (ANA): NEGATIVE

## 2022-04-14 LAB — RHEUMATOID FACTOR: Rheumatoid fact SerPl-aCnc: <14 IU/mL (ref <14)

## 2022-08-09 ENCOUNTER — Encounter: Payer: PRIVATE HEALTH INSURANCE | Admitting: Internal Medicine

## 2022-08-09 NOTE — Progress Notes (Deleted)
Office Visit Note  Patient: Allison Lowery             Date of Birth: 07-06-1970           MRN: [id number]             PCP: Elenore Paddy, NP Referring: Elenore Paddy, NP Visit Date: 08/09/2022 Occupation: @GUAROCC @  Subjective:  No chief complaint on file.   History of Present Illness: Allison Lowery is a 52 y.o. female ***     Activities of Daily Living:  Patient reports morning stiffness for *** {minute/hour:19697}.   Patient {ACTIONS;DENIES/REPORTS:21021675::"Denies"} nocturnal pain.  Difficulty dressing/grooming: {ACTIONS;DENIES/REPORTS:21021675::"Denies"} Difficulty climbing stairs: {ACTIONS;DENIES/REPORTS:21021675::"Denies"} Difficulty getting out of chair: {ACTIONS;DENIES/REPORTS:21021675::"Denies"} Difficulty using hands for taps, buttons, cutlery, and/or writing: {ACTIONS;DENIES/REPORTS:21021675::"Denies"}  No Rheumatology ROS completed.   PMFS History:  Patient Active Problem List   Diagnosis Date Noted   Felon of finger 04/01/2022   Rash 04/01/2022   Vitamin D deficiency 02/04/2022   Diabetes mellitus screening 02/04/2022   Encounter for lipid screening for cardiovascular disease 02/04/2022   Encounter for hepatitis C screening test for low risk patient 02/04/2022   Encounter for general adult medical examination with abnormal findings 02/04/2022   Fatigue 02/04/2022   Liver cyst 02/04/2022   Paresthesia 02/04/2022   COVID-19 12/17/2021   Right upper quadrant abdominal pain 07/13/2021   Vitamin D deficiency disease 06/05/2020   Mitral valve prolapse 10/05/2010    Past Medical History:  Diagnosis Date   CHEST PAIN, PLEURITIC 03/26/2009   MVP (mitral valve prolapse)    No problem no cardiologist since 52 years old no issues   Palpitations 10/30/2007    Family History  Problem Relation Age of Onset   Hypertension Mother    Thyroid disease Mother    Hypertension Father    Past Surgical History:  Procedure Laterality Date   BLADDER SUSPENSION   07/04/2008   DR. SCOTT MACDIARMID   DILATION AND CURETTAGE OF UTERUS     IUD REMOVAL N/A 12/28/2013   Procedure: INTRAUTERINE DEVICE (IUD) REMOVAL;  Surgeon: Dara Lords, MD;  Location: [facility];  Service: Gynecology;  Laterality: N/A;   LAPAROSCOPIC BILATERAL SALPINGECTOMY Bilateral 12/28/2013   Procedure: LAPAROSCOPIC BILATERAL SALPINGECTOMY ;  Surgeon: Dara Lords, MD;  Location: [facility];  Service: Gynecology;  Laterality: Bilateral;   LAPAROSCOPIC VAGINAL HYSTERECTOMY WITH SALPINGECTOMY Left 12/07/2018   Procedure: LAPAROSCOPIC ASSISTED VAGINAL HYSTERECTOMY LEFT OOPHARECTOMY;  Surgeon: Harold Hedge, MD;  Location: [facility];  Service: Gynecology;  Laterality: Left;  NEED BED   Social History   Social History Narrative   Not on file   Immunization History  Administered Date(s) Administered   PFIZER(Purple Top)SARS-COV-2 Vaccination 01/30/2019, 02/20/2019   Tdap 11/09/2013     Objective: Vital Signs: LMP 05/22/2018    Physical Exam   Musculoskeletal Exam: ***  CDAI Exam: CDAI Score: -- Patient Global: --; Provider Global: -- Swollen: --; Tender: -- Joint Exam 08/09/2022   No joint exam has been documented for this visit   There is currently no information documented on the homunculus. Go to the Rheumatology activity and complete the homunculus joint exam.  Investigation: No additional findings.  Imaging: No results found.  Recent Labs: Lab Results  Component Value Date   WBC 6.0 04/02/2022   HGB 13.0 04/02/2022   PLT 197.0 04/02/2022   NA 139 02/04/2022   K 4.7 02/04/2022   CL 100 02/04/2022   CO2 27 02/04/2022   GLUCOSE  83 02/04/2022   BUN 9 02/04/2022   CREATININE 0.76 02/04/2022   BILITOT 0.6 02/04/2022   ALKPHOS 48 02/04/2022   AST 18 02/04/2022   ALT 12 02/04/2022   PROT 7.5 02/04/2022   ALBUMIN 4.7 02/04/2022   CALCIUM 10.1 02/04/2022   GFRAA >60 03/22/2016    Speciality Comments: No specialty comments  available.  Procedures:  No procedures performed Allergies: Amoxicillin, Erythromycin, Penicillin g benzathine, Sulfamethoxazole-trimethoprim, Tetracycline, and Latex   Assessment / Plan:     Visit Diagnoses: No diagnosis found.  Orders: No orders of the defined types were placed in this encounter.  No orders of the defined types were placed in this encounter.   Face-to-face time spent with patient was *** minutes. Greater than 50% of time was spent in counseling and coordination of care.  Follow-Up Instructions: No follow-ups on file.   Fuller Plan, MD  Note - This record has been created using [employer].  Chart creation errors have been sought, but may not always  have been located. Such creation errors do not reflect on  the standard of medical care.

## 2023-09-16 ENCOUNTER — Emergency Department (HOSPITAL_COMMUNITY)
Admission: EM | Admit: 2023-09-16 | Discharge: 2023-09-16 | Discharge status: Against Medical Advice | Payer: PRIVATE HEALTH INSURANCE | Attending: Emergency Medicine | Admitting: Emergency Medicine

## 2023-09-16 ENCOUNTER — Encounter (HOSPITAL_COMMUNITY): Payer: Self-pay | Admitting: Emergency Medicine

## 2023-09-16 ENCOUNTER — Ambulatory Visit: Payer: Self-pay

## 2023-09-16 ENCOUNTER — Other Ambulatory Visit: Payer: Self-pay

## 2023-09-16 DIAGNOSIS — R519 Headache, unspecified: Secondary | ICD-10-CM | POA: Diagnosis not present

## 2023-09-16 DIAGNOSIS — Z5321 Procedure and treatment not carried out due to patient leaving prior to being seen by health care provider: Secondary | ICD-10-CM | POA: Insufficient documentation

## 2023-09-16 DIAGNOSIS — M791 Myalgia, unspecified site: Secondary | ICD-10-CM | POA: Insufficient documentation

## 2023-09-16 NOTE — ED Triage Notes (Addendum)
 Patient c/o body aches, headache after taking new medication Fosamax last night. Patient denies chest pain and SOB. Patient denies N/V.

## 2023-09-16 NOTE — Telephone Encounter (Signed)
 FYI Only or Action Required?: Action required by provider: request for appointment.  Patient was last seen in primary care on 04/01/2022 by Allison Lauraine BRAVO, NP.  Called Nurse Triage reporting Medication Problem.  Symptoms began yesterday.  Interventions attempted: Nothing.  Symptoms are: unchanged.Believes she is reacting to [place] max prescribed by OG/GYN. Body aches, chills. No availability, going to UC.  Triage Disposition: Call PCP Now  Patient/caregiver understands and will follow disposition?: Yes   Copied from CRM #[id number]. Topic: Clinical - Red Word Triage >> Sep 16, 2023  9:21 AM Laymon HERO wrote: Red Word that prompted transfer to Nurse Triage: Medication for osteoprosis- fosamax- having side effects to flu like symptoms Reason for Disposition  [1] Caller has URGENT medicine question about med that primary care doctor (or NP/PA) or specialist prescribed AND [2] triager unable to answer question  Answer Assessment - Initial Assessment Questions 1. NAME of MEDICINE: What medicine(s) are you calling about?     Fosamax 2. QUESTION: What is your question? (e.g., double dose of medicine, side effect)     Side effects - flu symptoms 3. PRESCRIBER: Who prescribed the medicine? Reason: if prescribed by specialist, call should be referred to that group.     OB/GYN 4. SYMPTOMS: Do you have any symptoms? If Yes, ask: What symptoms are you having?  How bad are the symptoms (e.g., mild, moderate, severe)     Body aches, chills, headaches 5. PREGNANCY:  Is there any chance that you are pregnant? When was your last menstrual period?     no  Protocols used: Medication Question Call-A-AH

## 2023-09-16 NOTE — ED Notes (Signed)
 Patient no where in the lobby or outside   Other patients state she has left a while ago
